# Patient Record
Sex: Female | Born: 1965 | Race: Black or African American | Hispanic: No | Marital: Single | Smoking: Former smoker
Health system: Southern US, Community
[De-identification: ages and names within clinical notes are randomized; demographics above are authoritative.]

## PROBLEM LIST (undated history)

## (undated) DIAGNOSIS — C55 Malignant neoplasm of uterus, part unspecified: Secondary | ICD-10-CM

## (undated) DIAGNOSIS — I10 Essential (primary) hypertension: Secondary | ICD-10-CM

## (undated) DIAGNOSIS — C801 Malignant (primary) neoplasm, unspecified: Secondary | ICD-10-CM

## (undated) HISTORY — PX: ABDOMINAL HYSTERECTOMY: SHX81

---

## 2000-09-12 ENCOUNTER — Emergency Department (HOSPITAL_COMMUNITY): Admission: EM | Admit: 2000-09-12 | Discharge: 2000-09-12 | Payer: Self-pay | Admitting: Emergency Medicine

## 2000-10-24 ENCOUNTER — Emergency Department (HOSPITAL_COMMUNITY): Admission: EM | Admit: 2000-10-24 | Discharge: 2000-10-24 | Payer: Self-pay | Admitting: Emergency Medicine

## 2000-10-25 ENCOUNTER — Encounter: Payer: Self-pay | Admitting: Emergency Medicine

## 2000-11-05 ENCOUNTER — Emergency Department (HOSPITAL_COMMUNITY): Admission: EM | Admit: 2000-11-05 | Discharge: 2000-11-05 | Payer: Self-pay | Admitting: Emergency Medicine

## 2000-11-10 ENCOUNTER — Encounter: Payer: Self-pay | Admitting: General Surgery

## 2000-11-11 ENCOUNTER — Encounter (INDEPENDENT_AMBULATORY_CARE_PROVIDER_SITE_OTHER): Payer: Self-pay | Admitting: *Deleted

## 2000-11-11 ENCOUNTER — Ambulatory Visit (HOSPITAL_COMMUNITY): Admission: RE | Admit: 2000-11-11 | Discharge: 2000-11-12 | Payer: Self-pay | Admitting: General Surgery

## 2002-07-07 ENCOUNTER — Emergency Department (HOSPITAL_COMMUNITY): Admission: EM | Admit: 2002-07-07 | Discharge: 2002-07-07 | Payer: Self-pay | Admitting: Emergency Medicine

## 2002-07-25 ENCOUNTER — Encounter: Admission: RE | Admit: 2002-07-25 | Discharge: 2002-07-25 | Payer: Self-pay | Admitting: *Deleted

## 2003-09-27 ENCOUNTER — Emergency Department (HOSPITAL_COMMUNITY): Admission: AD | Admit: 2003-09-27 | Discharge: 2003-09-27 | Payer: Self-pay | Admitting: Family Medicine

## 2004-02-15 ENCOUNTER — Inpatient Hospital Stay (HOSPITAL_COMMUNITY): Admission: AD | Admit: 2004-02-15 | Discharge: 2004-02-15 | Payer: Self-pay | Admitting: Obstetrics & Gynecology

## 2004-04-01 ENCOUNTER — Emergency Department (HOSPITAL_COMMUNITY): Admission: EM | Admit: 2004-04-01 | Discharge: 2004-04-01 | Payer: Self-pay | Admitting: *Deleted

## 2005-09-22 ENCOUNTER — Emergency Department (HOSPITAL_COMMUNITY): Admission: EM | Admit: 2005-09-22 | Discharge: 2005-09-22 | Payer: Self-pay | Admitting: Emergency Medicine

## 2006-02-15 ENCOUNTER — Ambulatory Visit: Payer: Self-pay | Admitting: Nurse Practitioner

## 2006-02-18 ENCOUNTER — Ambulatory Visit: Payer: Self-pay | Admitting: *Deleted

## 2006-02-19 ENCOUNTER — Ambulatory Visit: Payer: Self-pay | Admitting: Nurse Practitioner

## 2006-03-05 ENCOUNTER — Ambulatory Visit: Payer: Self-pay | Admitting: Nurse Practitioner

## 2006-03-05 ENCOUNTER — Other Ambulatory Visit: Admission: RE | Admit: 2006-03-05 | Discharge: 2006-03-05 | Payer: Self-pay | Admitting: Nurse Practitioner

## 2006-03-07 ENCOUNTER — Ambulatory Visit (HOSPITAL_COMMUNITY): Admission: RE | Admit: 2006-03-07 | Discharge: 2006-03-07 | Payer: Self-pay | Admitting: Family Medicine

## 2006-03-09 ENCOUNTER — Emergency Department (HOSPITAL_COMMUNITY): Admission: EM | Admit: 2006-03-09 | Discharge: 2006-03-09 | Payer: Self-pay | Admitting: Emergency Medicine

## 2006-03-12 ENCOUNTER — Ambulatory Visit (HOSPITAL_COMMUNITY): Admission: RE | Admit: 2006-03-12 | Discharge: 2006-03-12 | Payer: Self-pay | Admitting: Family Medicine

## 2006-03-19 ENCOUNTER — Ambulatory Visit: Payer: Self-pay | Admitting: Nurse Practitioner

## 2006-05-14 ENCOUNTER — Emergency Department (HOSPITAL_COMMUNITY): Admission: EM | Admit: 2006-05-14 | Discharge: 2006-05-14 | Payer: Self-pay | Admitting: Emergency Medicine

## 2006-11-21 ENCOUNTER — Emergency Department (HOSPITAL_COMMUNITY): Admission: EM | Admit: 2006-11-21 | Discharge: 2006-11-21 | Payer: Self-pay | Admitting: Emergency Medicine

## 2007-06-01 ENCOUNTER — Encounter (INDEPENDENT_AMBULATORY_CARE_PROVIDER_SITE_OTHER): Payer: Self-pay | Admitting: *Deleted

## 2008-01-11 ENCOUNTER — Ambulatory Visit: Payer: Self-pay | Admitting: Internal Medicine

## 2008-01-13 ENCOUNTER — Ambulatory Visit: Payer: Self-pay | Admitting: Internal Medicine

## 2008-02-06 ENCOUNTER — Emergency Department (HOSPITAL_COMMUNITY): Admission: EM | Admit: 2008-02-06 | Discharge: 2008-02-06 | Payer: Self-pay | Admitting: Emergency Medicine

## 2009-03-13 ENCOUNTER — Emergency Department (HOSPITAL_COMMUNITY): Admission: EM | Admit: 2009-03-13 | Discharge: 2009-03-13 | Payer: Self-pay | Admitting: Emergency Medicine

## 2009-05-08 ENCOUNTER — Encounter (INDEPENDENT_AMBULATORY_CARE_PROVIDER_SITE_OTHER): Payer: Self-pay | Admitting: Internal Medicine

## 2009-05-08 ENCOUNTER — Ambulatory Visit: Payer: Self-pay | Admitting: Family Medicine

## 2009-05-08 LAB — CONVERTED CEMR LAB
ALT: 11 units/L (ref 0–35)
AST: 15 units/L (ref 0–37)
Albumin: 4.3 g/dL (ref 3.5–5.2)
Alkaline Phosphatase: 90 units/L (ref 39–117)
BUN: 7 mg/dL (ref 6–23)
Basophils Absolute: 0 10*3/uL (ref 0.0–0.1)
Basophils Relative: 1 % (ref 0–1)
CO2: 25 meq/L (ref 19–32)
Calcium: 9.4 mg/dL (ref 8.4–10.5)
Chloride: 103 meq/L (ref 96–112)
Creatinine, Ser: 0.79 mg/dL (ref 0.40–1.20)
Eosinophils Absolute: 0.1 10*3/uL (ref 0.0–0.7)
Eosinophils Relative: 2 % (ref 0–5)
Glucose, Bld: 75 mg/dL (ref 70–99)
HCT: 37 % (ref 36.0–46.0)
Hemoglobin: 11.6 g/dL — ABNORMAL LOW (ref 12.0–15.0)
Lymphocytes Relative: 38 % (ref 12–46)
Lymphs Abs: 2.5 10*3/uL (ref 0.7–4.0)
MCHC: 31.4 g/dL (ref 30.0–36.0)
MCV: 88.5 fL (ref 78.0–100.0)
Monocytes Absolute: 0.5 10*3/uL (ref 0.1–1.0)
Monocytes Relative: 7 % (ref 3–12)
Neutro Abs: 3.4 10*3/uL (ref 1.7–7.7)
Neutrophils Relative %: 52 % (ref 43–77)
Platelets: 220 10*3/uL (ref 150–400)
Potassium: 3.8 meq/L (ref 3.5–5.3)
RBC: 4.18 M/uL (ref 3.87–5.11)
RDW: 14.2 % (ref 11.5–15.5)
Sodium: 141 meq/L (ref 135–145)
Total Bilirubin: 0.3 mg/dL (ref 0.3–1.2)
Total Protein: 7.3 g/dL (ref 6.0–8.3)
WBC: 6.5 10*3/uL (ref 4.0–10.5)

## 2009-05-09 ENCOUNTER — Encounter (INDEPENDENT_AMBULATORY_CARE_PROVIDER_SITE_OTHER): Payer: Self-pay | Admitting: Internal Medicine

## 2009-06-10 ENCOUNTER — Emergency Department (HOSPITAL_COMMUNITY): Admission: EM | Admit: 2009-06-10 | Discharge: 2009-06-10 | Payer: Self-pay | Admitting: Family Medicine

## 2009-08-23 ENCOUNTER — Emergency Department (HOSPITAL_COMMUNITY): Admission: EM | Admit: 2009-08-23 | Discharge: 2009-08-23 | Payer: Self-pay | Admitting: Family Medicine

## 2009-08-30 ENCOUNTER — Ambulatory Visit (HOSPITAL_COMMUNITY): Admission: RE | Admit: 2009-08-30 | Discharge: 2009-08-30 | Payer: Self-pay | Admitting: Internal Medicine

## 2009-09-16 ENCOUNTER — Encounter: Admission: RE | Admit: 2009-09-16 | Discharge: 2009-09-16 | Payer: Self-pay | Admitting: Internal Medicine

## 2009-10-25 ENCOUNTER — Encounter: Admission: RE | Admit: 2009-10-25 | Discharge: 2009-10-25 | Payer: Self-pay | Admitting: Nephrology

## 2010-04-25 ENCOUNTER — Emergency Department (HOSPITAL_COMMUNITY): Admission: EM | Admit: 2010-04-25 | Discharge: 2010-04-25 | Payer: Self-pay | Admitting: Emergency Medicine

## 2010-10-05 ENCOUNTER — Encounter: Payer: Self-pay | Admitting: Internal Medicine

## 2010-10-20 ENCOUNTER — Other Ambulatory Visit: Payer: Self-pay | Admitting: Family Medicine

## 2010-10-20 DIAGNOSIS — Z1231 Encounter for screening mammogram for malignant neoplasm of breast: Secondary | ICD-10-CM

## 2010-10-21 ENCOUNTER — Ambulatory Visit (HOSPITAL_COMMUNITY): Payer: Self-pay

## 2010-10-21 ENCOUNTER — Ambulatory Visit (HOSPITAL_COMMUNITY)
Admission: RE | Admit: 2010-10-21 | Discharge: 2010-10-21 | Disposition: A | Discharge status: Home / Self Care | Payer: Medicaid Other | Source: Ambulatory Visit | Attending: Family Medicine | Admitting: Family Medicine

## 2010-10-21 ENCOUNTER — Encounter (HOSPITAL_COMMUNITY): Payer: Self-pay

## 2010-10-21 DIAGNOSIS — Z1231 Encounter for screening mammogram for malignant neoplasm of breast: Secondary | ICD-10-CM

## 2010-10-21 HISTORY — DX: Malignant (primary) neoplasm, unspecified: C80.1

## 2010-11-24 ENCOUNTER — Other Ambulatory Visit (HOSPITAL_COMMUNITY)
Admission: RE | Admit: 2010-11-24 | Discharge: 2010-11-24 | Disposition: A | Discharge status: Home / Self Care | Payer: Medicaid Other | Source: Ambulatory Visit | Attending: Family Medicine | Admitting: Family Medicine

## 2010-11-24 ENCOUNTER — Other Ambulatory Visit: Payer: Self-pay | Admitting: Family Medicine

## 2010-11-24 ENCOUNTER — Encounter (INDEPENDENT_AMBULATORY_CARE_PROVIDER_SITE_OTHER): Payer: Self-pay | Admitting: *Deleted

## 2010-11-24 DIAGNOSIS — Z01419 Encounter for gynecological examination (general) (routine) without abnormal findings: Secondary | ICD-10-CM | POA: Insufficient documentation

## 2010-11-24 LAB — CONVERTED CEMR LAB
Chlamydia, DNA Probe: NEGATIVE
GC Probe Amp, Genital: NEGATIVE

## 2010-11-28 LAB — COMPREHENSIVE METABOLIC PANEL
ALT: 13 U/L (ref 0–35)
AST: 20 U/L (ref 0–37)
Albumin: 3.7 g/dL (ref 3.5–5.2)
Alkaline Phosphatase: 86 U/L (ref 39–117)
BUN: 6 mg/dL (ref 6–23)
CO2: 28 mEq/L (ref 19–32)
Calcium: 9.2 mg/dL (ref 8.4–10.5)
Chloride: 105 mEq/L (ref 96–112)
Creatinine, Ser: 0.73 mg/dL (ref 0.4–1.2)
GFR calc Af Amer: >60 mL/min (ref >60)
GFR calc non Af Amer: >60 mL/min (ref >60)
Glucose, Bld: 106 mg/dL — ABNORMAL HIGH (ref 70–99)
Potassium: 3.5 mEq/L (ref 3.5–5.1)
Sodium: 141 mEq/L (ref 135–145)
Total Bilirubin: 0.6 mg/dL (ref 0.3–1.2)
Total Protein: 6.7 g/dL (ref 6.0–8.3)

## 2010-11-28 LAB — CBC
HCT: 34.7 % — ABNORMAL LOW (ref 36.0–46.0)
Hemoglobin: 11.7 g/dL — ABNORMAL LOW (ref 12.0–15.0)
MCH: 28.6 pg (ref 26.0–34.0)
MCHC: 33.7 g/dL (ref 30.0–36.0)
MCV: 84.8 fL (ref 78.0–100.0)
Platelets: 206 10*3/uL (ref 150–400)
RBC: 4.09 MIL/uL (ref 3.87–5.11)
RDW: 13.9 % (ref 11.5–15.5)
WBC: 6.8 10*3/uL (ref 4.0–10.5)

## 2010-11-28 LAB — DIFFERENTIAL
Basophils Absolute: 0 10*3/uL (ref 0.0–0.1)
Basophils Relative: 0 % (ref 0–1)
Eosinophils Absolute: 0.1 10*3/uL (ref 0.0–0.7)
Eosinophils Relative: 2 % (ref 0–5)
Lymphocytes Relative: 33 % (ref 12–46)
Lymphs Abs: 2.3 10*3/uL (ref 0.7–4.0)
Monocytes Absolute: 0.5 10*3/uL (ref 0.1–1.0)
Monocytes Relative: 7 % (ref 3–12)
Neutro Abs: 3.9 10*3/uL (ref 1.7–7.7)
Neutrophils Relative %: 58 % (ref 43–77)

## 2010-11-28 LAB — URINALYSIS, ROUTINE W REFLEX MICROSCOPIC
Glucose, UA: NEGATIVE mg/dL
Ketones, ur: NEGATIVE mg/dL
Leukocytes, UA: NEGATIVE
Nitrite: NEGATIVE
Protein, ur: 30 mg/dL — AB
Specific Gravity, Urine: 1.026 (ref 1.005–1.030)
Urobilinogen, UA: 1 mg/dL (ref 0.0–1.0)
pH: 6 (ref 5.0–8.0)

## 2010-11-28 LAB — URINE MICROSCOPIC-ADD ON

## 2010-11-28 LAB — HEMOCCULT GUIAC POC 1CARD (OFFICE): Fecal Occult Bld: NEGATIVE

## 2010-11-28 LAB — LIPASE, BLOOD: Lipase: 22 U/L (ref 11–59)

## 2010-12-12 ENCOUNTER — Other Ambulatory Visit: Payer: Self-pay | Admitting: Nephrology

## 2010-12-12 DIAGNOSIS — N2889 Other specified disorders of kidney and ureter: Secondary | ICD-10-CM

## 2010-12-16 LAB — TSH: TSH: 2.618 u[IU]/mL (ref 0.350–4.500)

## 2010-12-17 ENCOUNTER — Ambulatory Visit
Admission: RE | Admit: 2010-12-17 | Discharge: 2010-12-17 | Disposition: A | Discharge status: Home / Self Care | Payer: Medicaid Other | Source: Ambulatory Visit | Attending: Nephrology | Admitting: Nephrology

## 2010-12-17 DIAGNOSIS — N2889 Other specified disorders of kidney and ureter: Secondary | ICD-10-CM

## 2010-12-17 MED ORDER — IOHEXOL 350 MG/ML SOLN
100.0000 mL | Freq: Once | INTRAVENOUS | Status: AC | PRN
  Administered 2010-12-17: 100 mL via INTRAVENOUS

## 2010-12-22 LAB — TSH: TSH: 1.426 u[IU]/mL (ref 0.350–4.500)

## 2010-12-22 LAB — POCT RAPID STREP A (OFFICE): Streptococcus, Group A Screen (Direct): NEGATIVE

## 2011-01-30 NOTE — H&P (Signed)
Carly Simmons. [facility]  Patient:    Carly Simmons, Carly Simmons                       MRN: [id number] Adm. Date:  [id number] Disc. Date: [id number] Attending:  Lorre Nick                         History and Physical  CHIEF COMPLAINT: The patient is a 45 year old female with epigastric abdominal pain and known gallstones, coming in now for elective laparoscopic cholecystectomy.  HISTORY OF PRESENT ILLNESS: The patient was seen in the emergency room on October 25, 2000 with abdominal pain in the upper abdomen, right upper quadrant, radiating across both sides.  She had nausea and vomiting at that time.  She had an episode prior to that about a month or so beforehand, at which time she had vomited blood.  However, at that time the focus was on bleeding and possible reflux and ulcer disease.  No ultrasound was done. However, on October 25, 2000 an ultrasound was done and this demonstrated dense calcification in the region of the gallbladder fossa consistent with contracted gallbladder filled with multiple stones.  Her common duct was mildly dilated.  Her liver function tests are unknown.  She now comes in for elective laparoscopic cholecystectomy.  PAST MEDICAL/SURGICAL HISTORY:  1. Uterine cancer, for which she had hysterectomy. It is unknown whether or     not her tubes and ovaries were removed but she feels they were both left     in place.  2. History of hypertension.  Blood pressure is currently in control on no     medications.  3. Hypothyroidism.  CURRENT MEDICATIONS: She is only taking pain medications which were given to her in the emergency room.  ALLERGIES:  1. CODEINE.  This is not really an allergy but she does not like taking it     because of a previous history of substance abuse.  2. She also has an intolerance to ASPIRIN.  REVIEW OF SYSTEMS: She has had normal bowel movements.  No jaundice, fever, or chills.  No blood in urine or stool.  She has  had no hematemesis over the past several weeks.  PHYSICAL EXAMINATION:  VITAL SIGNS: On examination in my office her blood pressure was normal at 130/78.  She was afebrile.  HEENT: Head normocephalic, atraumatic.  Sclerae anicteric.  NECK: Supple, without palpable masses and clear.  No bruits.  CHEST: Clear.  CARDIAC: Regular rate and rhythm without murmurs, gallops, lifts, or heaves.  ABDOMEN: Soft, nontender.  No current tenderness in the right upper quadrant. No palpable masses.  RECTAL/PELVIC: Examinations are deferred.  NEUROLOGIC: Cranial nerves 2-12 grossly intact.  LABORATORY DATA: Laboratory studies are pending.  Ultrasound report demonstrates gallstones and contracted gallbladder fossa.  IMPRESSION: Symptomatic cholelithiasis with possible mildly dilated common duct.  PLAN: The plan is to perform laparoscopic cholecystectomy with possible intraoperative cholangiogram.  This will be determined by preoperative liver function tests.  Preoperative antibiotics will be given and she will go to the operating room for laparoscopic procedure.  The risks and benefits of this have been explained to the patient along with the possibility of requiring an open procedure. DD:  11/10/00 TD:  11/11/00 Job: [id number]

## 2011-01-30 NOTE — Op Note (Signed)
[place]  Patient:    Carly Simmons, Carly Simmons                       MRN: [id number] Proc. Date: 11/11/00 Adm. Date:  [id number] Attending:  Cherylynn Ridges                           Operative Report  PREOPERATIVE DIAGNOSIS:  Symptomatic cholelithiasis.  POSTOPERATIVE DIAGNOSIS:  Symptomatic cholelithiasis.  OPERATION PERFORMED:  Laparoscopic cholecystectomy.  SURGEON:  Jimmye Norman, M.D.  ASSISTANT:  None.  ANESTHESIA:  General endotracheal.  ESTIMATED BLOOD LOSS:  Less than 20 cc.  COMPLICATIONS:  None.  CONDITION:  Stable.  FINDINGS:  Chronic gallbladder disease, no evident of acute inflammation. Anatomy appeared normal.  No cholangiogram was done.  INDICATIONS FOR PROCEDURE:  The patient is a 45 year old female admitted with multiple attacks of abdominal pain, epigastric.  Had been treated for upper gastrointestinal ulcer disease found on most recent hospital emergency room admission to have gallstones and was brought to the operating room for cholecystectomy.  DESCRIPTION OF PROCEDURE:  The patient was taken to the operating room and placed on the table in supine position.  After an adequate general anesthetic was administered, he was prepped and draped in the usual sterile fashion exposing the midline and the right upper quadrant of the abdomen.  A superumbilical curvilinear incision was made down to the midline fascia through which a Veress needle was passed into the peritoneal cavity while tenting up on the anterior abdominal wall using sharp towel clamps.  We confirmed positioning of the Veress needle using the saline test.  Once this was done, carbon dioxide insufflation was instilled into the peritoneal cavity through the Veress needle up to a maximal intra-abdominal pressure of 15 mmHg. Subsequently, an 11 mm cannula was passed through the superumbilical fascia into the peritoneal cavity and confirmed to be in adequate position  using the laparoscope with attached camera and light source.  Subsequently, two 5 mm cannulas and trocars and a subxiphoid 11 mm cannula and trocar were passed into the peritoneal cavity under direct vision.  The patient was placed in steeper reverse Trendelenburg position.  The left side was tilted down and the dissection was begun.  The dome of the gallbladder was grasped and retracted into the right upper quadrant and the anterior abdominal wall portion of the abdomen.  We then placed a second grasper onto the infundibulum of the gallbladder.  This opened up the peritoneum overlying the triangle of Calot and hepatoduodenal triangle. The primary structures of the cystic duct and the cystic artery were dissected out using electrocautery and blunt dissection and proximal triple Endoclips were placed on the cystic duct and cystic artery and subsequently transected after distal ligation with the clips.  We then dissected the gallbladder out of its bed with minimal difficulty.  We removed it from the superumbilical fascia using the Endocatch bag with minimal spillage.  There was some bile spillage into the abdomen which was irrigated out.  Subsequent to irrigation and controlling hemostasis in the gallbladder bed, and removing the gallbladder from the superumbilical fascia, we closed the abdomen after allownig most of the gas to escape through the subxiphoid incision.  We closed the midline superumbilical fascia using a figure-of-eight stitch of 0 Vicryl. This was passed on a UR-6 needle.  Subsequently, the skin was injected and subcutaneous tissues injected with 0.25% Marcaine with epinephrine.  The skin was closed using running subcuticular stitch of 4-0 Vicryl.  Steri-Strips were applied to the wound along with sterile dressings.  Sponge, needle and instrument counts were correct at the conclusion of the case. DD:  11/11/00 TD:  11/11/00 Job: [id number]

## 2011-05-07 ENCOUNTER — Inpatient Hospital Stay (INDEPENDENT_AMBULATORY_CARE_PROVIDER_SITE_OTHER)
Admission: RE | Admit: 2011-05-07 | Discharge: 2011-05-07 | Disposition: A | Discharge status: Home / Self Care | Payer: Medicare Other | Source: Ambulatory Visit | Attending: Family Medicine | Admitting: Family Medicine

## 2011-05-07 DIAGNOSIS — K219 Gastro-esophageal reflux disease without esophagitis: Secondary | ICD-10-CM

## 2011-08-14 ENCOUNTER — Emergency Department (INDEPENDENT_AMBULATORY_CARE_PROVIDER_SITE_OTHER): Payer: Medicare Other

## 2011-08-14 ENCOUNTER — Encounter (HOSPITAL_COMMUNITY): Payer: Self-pay | Admitting: Emergency Medicine

## 2011-08-14 ENCOUNTER — Emergency Department (INDEPENDENT_AMBULATORY_CARE_PROVIDER_SITE_OTHER)
Admission: EM | Admit: 2011-08-14 | Discharge: 2011-08-14 | Disposition: A | Discharge status: Home / Self Care | Payer: Medicare Other | Source: Home / Self Care | Attending: Emergency Medicine | Admitting: Emergency Medicine

## 2011-08-14 DIAGNOSIS — H6690 Otitis media, unspecified, unspecified ear: Secondary | ICD-10-CM

## 2011-08-14 DIAGNOSIS — J111 Influenza due to unidentified influenza virus with other respiratory manifestations: Secondary | ICD-10-CM

## 2011-08-14 HISTORY — DX: Essential (primary) hypertension: I10

## 2011-08-14 MED ORDER — AMOXICILLIN 500 MG PO CAPS: 1000.0000 mg | ORAL_CAPSULE | Freq: Three times a day (TID) | ORAL | Status: AC

## 2011-08-14 MED ORDER — IBUPROFEN 800 MG PO TABS
ORAL_TABLET | ORAL | Status: AC
  Filled 2011-08-14: qty 1

## 2011-08-14 MED ORDER — DICLOFENAC SODIUM 75 MG PO TBEC: 75.0000 mg | DELAYED_RELEASE_TABLET | Freq: Two times a day (BID) | ORAL | Status: AC

## 2011-08-14 MED ORDER — OSELTAMIVIR PHOSPHATE 75 MG PO CAPS: 75.0000 mg | ORAL_CAPSULE | Freq: Two times a day (BID) | ORAL | Status: AC

## 2011-08-14 MED ORDER — BENZONATATE 200 MG PO CAPS: 200.0000 mg | ORAL_CAPSULE | Freq: Three times a day (TID) | ORAL | Status: AC | PRN

## 2011-08-14 MED ORDER — IBUPROFEN 800 MG PO TABS
800.0000 mg | ORAL_TABLET | Freq: Once | ORAL | Status: AC
  Administered 2011-08-14: 800 mg via ORAL

## 2011-08-14 NOTE — ED Notes (Signed)
C/o of body aches, headaches, chest compression, ears popping and bad cough for 2 days. Also with productive cough that is "milky and brown".

## 2011-08-14 NOTE — ED Provider Notes (Signed)
History     CSN: [id number] Arrival date & time: 08/14/2011  6:11 PM   First MD Initiated Contact with Patient 08/14/11 1709      Chief Complaint  Patient presents with  . Generalized Body Aches    (Consider location/radiation/quality/duration/timing/severity/associated sxs/prior treatment) HPI Comments: Carly Simmons has had a two-day history of generalized aching, headache, fever, fatigue, cough productive of small amounts of brown sputum, wheezing, nasal congestion with clear drainage, and bilateral ear pain. She was exposed to some family members with similar problems. She has not had a flu vaccine this year. She does have a history of asthma. She's been taking Alka-Seltzer and NyQuil. She rates her pain as a 9 at its maximum and a 6 right now.   Past Medical History  Diagnosis Date  . Cancer   . Hypertension     Past Surgical History  Procedure Date  . Abdominal hysterectomy     History reviewed. No pertinent family history.  History  Substance Use Topics  . Smoking status: Not on file  . Smokeless tobacco: Not on file  . Alcohol Use: No    OB History    Grav Para Term Preterm Abortions TAB SAB Ect Mult Living                  Review of Systems  Constitutional: Positive for fever, chills and fatigue. Negative for unexpected weight change.  HENT: Positive for congestion, sore throat and rhinorrhea. Negative for ear pain.   Eyes: Negative for redness and visual disturbance.  Respiratory: Positive for cough. Negative for shortness of breath and wheezing.   Cardiovascular: Negative for chest pain and palpitations.  Gastrointestinal: Negative for nausea, vomiting, abdominal pain, diarrhea and constipation.  Genitourinary: Negative for dysuria, urgency and frequency.  Skin: Negative for rash.    Allergies  Codeine and Iohexol  Home Medications   Current Outpatient Rx  Name Route Sig Dispense Refill  . NYQUIL PO Oral Take by mouth as needed.      Marland Kitchen ALKA-SELTZER  GOLD PO Oral Take by mouth as needed.      . AMOXICILLIN 500 MG PO CAPS Oral Take 2 capsules (1,000 mg total) by mouth 3 (three) times daily. 60 capsule 0  . BENZONATATE 200 MG PO CAPS Oral Take 1 capsule (200 mg total) by mouth 3 (three) times daily as needed for cough. 30 capsule 0  . DICLOFENAC SODIUM 75 MG PO TBEC Oral Take 1 tablet (75 mg total) by mouth 2 (two) times daily. 20 tablet 0  . OSELTAMIVIR PHOSPHATE 75 MG PO CAPS Oral Take 1 capsule (75 mg total) by mouth every 12 (twelve) hours. 10 capsule 0    BP 154/100  Pulse 108  Temp(Src) 99.7 F (37.6 C) (Oral)  SpO2 97%  Physical Exam  Nursing note and vitals reviewed. Constitutional: She appears well-developed and well-nourished. No distress.  HENT:  Head: Normocephalic and atraumatic.  Right Ear: External ear normal.  Left Ear: External ear normal.  Nose: Nose normal.  Mouth/Throat: Oropharynx is clear and moist. No oropharyngeal exudate.       Her pharynx is erythematous. The left tympanic membrane was pink the right tympanic membrane was normal.  Eyes: Conjunctivae and EOM are normal. Pupils are equal, round, and reactive to light. Right eye exhibits no discharge. Left eye exhibits no discharge.  Neck: Normal range of motion. Neck supple.  Cardiovascular: Normal rate, regular rhythm and normal heart sounds.   Pulmonary/Chest: Effort normal and breath sounds  normal. No stridor. No respiratory distress. She has no wheezes. She has no rales. She exhibits no tenderness.  Lymphadenopathy:    She has no cervical adenopathy.  Skin: Skin is warm and dry. No rash noted. She is not diaphoretic.    ED Course  Procedures (including critical care time)  Labs Reviewed - No data to display Dg Chest 2 View  08/14/2011  *RADIOLOGY REPORT*  Clinical Data: Cough and fever.  CHEST - 2 VIEW  Comparison: None.  Findings: Two views of the chest demonstrate clear lungs. Heart and mediastinum are within normal limits.  The trachea is  midline. Bony structures are intact.  IMPRESSION: No acute chest findings.  Original Report Authenticated By: Richarda Overlie, M.D.     1. Influenza   2. Otitis media       MDM  She clinically has influenza. She was given a prescription for Tamiflu, although she's not sure she will get this filled because of the cost. We'll go ahead and treat her left otitis media with amoxicillin. She was also given something for cough and something for pain.        Roque Lias, MD 08/14/11 [phone]

## 2012-06-05 ENCOUNTER — Encounter (HOSPITAL_COMMUNITY): Payer: Self-pay | Admitting: Emergency Medicine

## 2012-06-05 ENCOUNTER — Emergency Department (INDEPENDENT_AMBULATORY_CARE_PROVIDER_SITE_OTHER)
Admission: EM | Admit: 2012-06-05 | Discharge: 2012-06-05 | Disposition: A | Discharge status: Home / Self Care | Payer: Medicare Other | Source: Home / Self Care | Attending: Emergency Medicine | Admitting: Emergency Medicine

## 2012-06-05 DIAGNOSIS — H109 Unspecified conjunctivitis: Secondary | ICD-10-CM

## 2012-06-05 MED ORDER — TOBRAMYCIN 0.3 % OP SOLN: 1.0000 [drp] | Freq: Three times a day (TID) | OPHTHALMIC | Status: AC

## 2012-06-05 MED ORDER — CETIRIZINE-PSEUDOEPHEDRINE ER 5-120 MG PO TB12: 1.0000 | ORAL_TABLET | Freq: Every day | ORAL | Status: DC

## 2012-06-05 MED ORDER — TETRAHYDROZOLINE HCL 0.05 % OP SOLN: 1.0000 [drp] | Freq: Three times a day (TID) | OPHTHALMIC | Status: AC

## 2012-06-05 NOTE — ED Notes (Signed)
Pt c/o irritation of the right eye since last nigh, denies paint. Pt states that when she woke this morning there was drainage from her eye. Pt denies fever, n/v.

## 2012-06-05 NOTE — ED Provider Notes (Signed)
History     CSN: [id number]  Arrival date & time 06/05/12  1000   First MD Initiated Contact with Patient 06/05/12 1007      Chief Complaint  Patient presents with  . Conjunctivitis    right eye irritation    (Consider location/radiation/quality/duration/timing/severity/associated sxs/prior treatment) HPI Comments: Patient presents to urgent care as it was last night she have had some discomfort and itchiness on her right eye denies any pain or recent injury and denies using contact lenses patient said she woke up this morning there was some colored drainage coming out of her right eye and her eye was matted shut. Denies any visual changes or further symptoms such as headache, visual changes or injury or trauma.  Patient is a 46 y.o. female presenting with conjunctivitis. The history is provided by the patient.  Conjunctivitis  The current episode started today. The onset was sudden. The problem has been unchanged. The problem is mild. Nothing relieves the symptoms. Exacerbated by: Blinking. Associated symptoms include eye itching, eye discharge and eye redness. Pertinent negatives include no fever, no decreased vision, no photophobia, no headaches, no rhinorrhea, no sore throat, no stridor, no swollen glands, no cough and no eye pain.    Past Medical History  Diagnosis Date  . Cancer   . Hypertension     Past Surgical History  Procedure Date  . Abdominal hysterectomy     History reviewed. No pertinent family history.  History  Substance Use Topics  . Smoking status: Not on file  . Smokeless tobacco: Not on file  . Alcohol Use: No    OB History    Grav Para Term Preterm Abortions TAB SAB Ect Mult Living                  Review of Systems  Constitutional: Negative for fever and activity change.  HENT: Negative for sore throat and rhinorrhea.   Eyes: Positive for discharge, redness and itching. Negative for photophobia, pain and visual disturbance.  Respiratory:  Negative for cough and stridor.   Neurological: Negative for dizziness and headaches.    Allergies  Codeine and Iohexol  Home Medications   Current Outpatient Rx  Name Route Sig Dispense Refill  . CETIRIZINE-PSEUDOEPHEDRINE ER 5-120 MG PO TB12 Oral Take 1 tablet by mouth daily. 30 tablet 0  . DICLOFENAC SODIUM 75 MG PO TBEC Oral Take 1 tablet (75 mg total) by mouth 2 (two) times daily. 20 tablet 0  . NYQUIL PO Oral Take by mouth as needed.      Marland Kitchen ALKA-SELTZER GOLD PO Oral Take by mouth as needed.      . TETRAHYDROZOLINE HCL 0.05 % OP SOLN Right Eye Place 1 drop into the right eye 3 (three) times daily. 15 mL 0  . TOBRAMYCIN SULFATE 0.3 % OP SOLN Right Eye Place 1 drop into the right eye 3 (three) times daily. 5 mL 0    BP 116/78  Pulse 66  Temp 98.6 F (37 C)  SpO2 100%  Physical Exam  Nursing note and vitals reviewed. Constitutional: She appears well-developed and well-nourished.  Eyes: EOM are normal. Pupils are equal, round, and reactive to light. No foreign bodies found. Right eye exhibits no chemosis and no discharge. Left eye exhibits no chemosis and no discharge. Right conjunctiva is injected. Right conjunctiva has no hemorrhage. Left conjunctiva is not injected. Left conjunctiva has no hemorrhage.  Neurological: She is alert.  Skin: No rash noted. No erythema.  ED Course  Procedures (including critical care time)  Labs Reviewed - No data to display No results found.   1. Conjunctivitis       MDM  Right-sided conjunctival hyperemia- no corneal disruption under fluorescein, test.tobrymicin prescription          Jimmie Molly, MD 06/05/12 1617

## 2012-06-23 ENCOUNTER — Other Ambulatory Visit: Payer: Self-pay | Admitting: Internal Medicine

## 2012-06-23 DIAGNOSIS — M810 Age-related osteoporosis without current pathological fracture: Secondary | ICD-10-CM

## 2012-07-22 ENCOUNTER — Emergency Department (INDEPENDENT_AMBULATORY_CARE_PROVIDER_SITE_OTHER)
Admission: EM | Admit: 2012-07-22 | Discharge: 2012-07-22 | Disposition: A | Discharge status: Home / Self Care | Payer: Medicare Other | Source: Home / Self Care

## 2012-07-22 ENCOUNTER — Encounter (HOSPITAL_COMMUNITY): Payer: Self-pay | Admitting: Emergency Medicine

## 2012-07-22 ENCOUNTER — Other Ambulatory Visit: Payer: Self-pay

## 2012-07-22 DIAGNOSIS — M940 Chondrocostal junction syndrome [Tietze]: Secondary | ICD-10-CM

## 2012-07-22 MED ORDER — NAPROXEN 500 MG PO TBEC: 500.0000 mg | DELAYED_RELEASE_TABLET | Freq: Two times a day (BID) | ORAL | Status: DC

## 2012-07-22 NOTE — ED Provider Notes (Signed)
History     CSN: [id number]  Arrival date & time 07/22/12  1153   None     Chief Complaint  Patient presents with  . Chest Pain    (Consider location/radiation/quality/duration/timing/severity/associated sxs/prior treatment) HPI Comments: 46 year old female who presents with complaints of left chest pain for 2 days. The pain is sharp, heating-type pain is located beneath the breast. She places her right hand beneath the breast over the lower costal margin and along the lateral left chest wall. She also states her some pain under the left scapula. Nothing makes it worse, immobility and naps help her get better. She denies shortness of breath, fever, cough or malaise. No GI or GU symptoms. Denies heaviness tightness fullness or pressure in the chest. She has been starting to exercise recently mainly by walking and taking deep breaths 2 to exercise may have contributed to his chest wall pain.   Past Medical History  Diagnosis Date  . Cancer   . Hypertension     Past Surgical History  Procedure Date  . Abdominal hysterectomy     No family history on file.  History  Substance Use Topics  . Smoking status: Not on file  . Smokeless tobacco: Not on file  . Alcohol Use: No    OB History    Grav Para Term Preterm Abortions TAB SAB Ect Mult Living                  Review of Systems  Constitutional: Negative for fever, chills and activity change.  HENT: Negative.   Respiratory: Negative.   Cardiovascular: Negative.   Musculoskeletal: Negative for arthralgias and gait problem.       As per HPI  Skin: Negative for color change, pallor and rash.  Neurological: Negative.     Allergies  Codeine and Iohexol  Home Medications   Current Outpatient Rx  Name  Route  Sig  Dispense  Refill  . SIMVASTATIN 20 MG PO TABS   Oral   Take 20 mg by mouth every evening.         Marland Kitchen CETIRIZINE-PSEUDOEPHEDRINE ER 5-120 MG PO TB12   Oral   Take 1 tablet by mouth daily.   30 tablet  0   . DICLOFENAC SODIUM 75 MG PO TBEC   Oral   Take 1 tablet (75 mg total) by mouth 2 (two) times daily.   20 tablet   0   . NAPROXEN 500 MG PO TBEC   Oral   Take 1 tablet (500 mg total) by mouth 2 (two) times daily with a meal.   20 tablet   0   . NYQUIL PO   Oral   Take by mouth as needed.           Marland Kitchen ALKA-SELTZER GOLD PO   Oral   Take by mouth as needed.             BP 131/90  Pulse 70  Temp 98.6 F (37 C) (Oral)  Resp 20  SpO2 100%  LMP 06/21/2012  Physical Exam  Constitutional: She is oriented to person, place, and time. She appears well-developed and well-nourished. No distress.  HENT:  Head: Normocephalic and atraumatic.  Eyes: EOM are normal. Pupils are equal, round, and reactive to light.  Neck: Normal range of motion. Neck supple.  Cardiovascular: Normal rate and normal heart sounds.   Pulmonary/Chest: Effort normal and breath sounds normal. No respiratory distress.  Abdominal: Soft. There is no tenderness. There is  no rebound and no guarding.  Musculoskeletal: She exhibits tenderness. She exhibits no edema.       Marked, reproducible tenderness in the right anterior and lateral costal margins. Tracking the left lateral ribs into the infrascapular musculature produces tenderness and pain. The patient palpates her own breast and denies thirst tenderness there.(The patient was dressed during the exam.)  Lymphadenopathy:    She has no cervical adenopathy.  Neurological: She is alert and oriented to person, place, and time. No cranial nerve deficit.  Skin: Skin is warm and dry.  Psychiatric: She has a normal mood and affect.    ED Course  Procedures (including critical care time)  Labs Reviewed - No data to display No results found.   1. Costochondritis, acute       MDM  Reassurance. He explained costochondritis and chest wall pain to the patient. She may apply ice off and on to the areas of soreness. Also Naprosyn EC 500 mg twice a day when  necessary pain. She may continue to walk however if the pain increases by taking deeper breaths he may have to slow down old it otherwise this should be okay. She has been advised that this is not a problem that is usually associated with the heart lungs or vital organs. EKG: Normal sinus rhythm with no ischemic changes. Suboptimal R-wave progression in the precordial leads       Hayden Rasmussen, NP 07/22/12 1417  Hayden Rasmussen, NP 07/22/12 1418

## 2012-07-22 NOTE — ED Notes (Signed)
Pt c/o chest discomfort on the left side x2 days... Sx include: pain at left side under breast/rib that radiates downward to left abd, frontal headache... Denies: fevers, vomiting, nauseas, diarrhea, edema, SOB... Hx of GERD... Pt is alert w/no signs of distress.

## 2012-07-24 NOTE — ED Provider Notes (Signed)
Medical screening examination/treatment/procedure(s) were performed by non-physician practitioner and as supervising physician I was immediately available for consultation/collaboration.   MORENO-COLL,Gerarda Conklin; MD   Brannan Cassedy Moreno-Coll, MD 07/24/12 0827 

## 2012-10-21 ENCOUNTER — Other Ambulatory Visit: Payer: Self-pay | Admitting: Internal Medicine

## 2012-10-21 DIAGNOSIS — Z1231 Encounter for screening mammogram for malignant neoplasm of breast: Secondary | ICD-10-CM

## 2012-11-16 ENCOUNTER — Ambulatory Visit
Admission: RE | Admit: 2012-11-16 | Discharge: 2012-11-16 | Disposition: A | Discharge status: Home / Self Care | Payer: Medicare Other | Source: Ambulatory Visit | Attending: Internal Medicine | Admitting: Internal Medicine

## 2012-11-16 DIAGNOSIS — Z1231 Encounter for screening mammogram for malignant neoplasm of breast: Secondary | ICD-10-CM

## 2013-02-23 ENCOUNTER — Emergency Department (HOSPITAL_COMMUNITY)
Admission: EM | Admit: 2013-02-23 | Discharge: 2013-02-23 | Disposition: A | Discharge status: Home / Self Care | Payer: Medicare Other | Source: Home / Self Care

## 2013-02-23 ENCOUNTER — Encounter (HOSPITAL_COMMUNITY): Payer: Self-pay

## 2013-02-23 DIAGNOSIS — R0982 Postnasal drip: Secondary | ICD-10-CM

## 2013-02-23 DIAGNOSIS — J309 Allergic rhinitis, unspecified: Secondary | ICD-10-CM

## 2013-02-23 DIAGNOSIS — K219 Gastro-esophageal reflux disease without esophagitis: Secondary | ICD-10-CM

## 2013-02-23 NOTE — ED Provider Notes (Signed)
History     CSN: [id number]  Arrival date & time 02/23/13  1209   None     Chief Complaint  Patient presents with  . Gastrophageal Reflux    (Consider location/radiation/quality/duration/timing/severity/associated sxs/prior treatment) HPI Comments: 47 year old morbidly obese female states that 2 days ago she was awakened during the night with frequent episodes of regurgitation, heartburn and spitting up has his secretions. She experienced burning in the esophagus and the throat. She started taking the purple pill at that time and now, 2 days later she is feeling just a little bit better. She is concerned that since she had gallbladder surgery several years ago that she may have "busted something open ". Denies abdominal pain. States when pressing on the epigastrium this actually helped her to feel better. Also complaining of cough and is clearing her throat frequently. She is being treated for allergy desensitization.   Past Medical History  Diagnosis Date  . Cancer   . Hypertension     Past Surgical History  Procedure Laterality Date  . Abdominal hysterectomy      History reviewed. No pertinent family history.  History  Substance Use Topics  . Smoking status: Not on file  . Smokeless tobacco: Not on file  . Alcohol Use: No    OB History   Grav Para Term Preterm Abortions TAB SAB Ect Mult Living                  Review of Systems  Constitutional: Negative for fever, chills, activity change, appetite change and fatigue.  HENT: Positive for congestion, sore throat and rhinorrhea. Negative for facial swelling, neck pain and neck stiffness.   Eyes: Negative.   Respiratory: Negative.   Cardiovascular: Negative.   Gastrointestinal: Positive for vomiting. Negative for abdominal pain and blood in stool.       As per history of present illness  Genitourinary: Negative.   Musculoskeletal: Negative.   Skin: Negative for pallor and rash.  Neurological: Negative.      Allergies  Codeine and Iohexol  Home Medications   Current Outpatient Rx  Name  Route  Sig  Dispense  Refill  . cetirizine-pseudoephedrine (ZYRTEC-D) 5-120 MG per tablet   Oral   Take 1 tablet by mouth daily.   30 tablet   0   . naproxen (EC-NAPROSYN) 500 MG EC tablet   Oral   Take 1 tablet (500 mg total) by mouth 2 (two) times daily with a meal.   20 tablet   0   . Pseudoeph-Doxylamine-DM-APAP (NYQUIL PO)   Oral   Take by mouth as needed.           . simvastatin (ZOCOR) 20 MG tablet   Oral   Take 20 mg by mouth every evening.         . Sodium & Potassium Bicarbonate (ALKA-SELTZER GOLD PO)   Oral   Take by mouth as needed.             BP 124/77  Pulse 64  Temp(Src) 98.3 F (36.8 C) (Oral)  Resp 16  SpO2 99%  LMP 06/21/2012  Physical Exam  Nursing note and vitals reviewed. Constitutional: She is oriented to person, place, and time. She appears well-developed and well-nourished. No distress.  HENT:  Oropharynx with spotty erythema and clear PND. No exudates or swelling.  Eyes: Conjunctivae and EOM are normal.  Neck: Normal range of motion. Neck supple.  Cardiovascular: Normal rate, regular rhythm and normal heart sounds.  Pulmonary/Chest: Effort normal and breath sounds normal. No respiratory distress.  Abdominal: Soft. She exhibits no distension and no mass. There is no rebound and no guarding.  Minor tenderness in the epigastrium.  Musculoskeletal: She exhibits no edema and no tenderness.  Lymphadenopathy:    She has no cervical adenopathy.  Neurological: She is alert and oriented to person, place, and time. She exhibits normal muscle tone.  Skin: Skin is warm and dry. No rash noted.  Psychiatric: She has a normal mood and affect.    ED Course  Procedures (including critical care time)  Labs Reviewed - No data to display No results found.   1. GERD (gastroesophageal reflux disease)   2. Allergic rhinitis due to allergen   3. PND  (post-nasal drip)       MDM  The patient has typical symptoms of GERD. She also has symptoms of allergic rhinitis and PND. Both of these are protruding to her cough. She is given instructions relating to GERD and diet associated with GERD. Is also advised to take Pepcid a.c. or Zantac 75 mg twice a day or just at bedtime as needed for symptom relief. She may also take Claritin 10 mg or Allegra 60 mg at bed time or during the day to help with allergies. Continue taking your purple pill (Nexium or Prilosec) as directed. Call your physician for an appointment to be followed. May need to have additional studies to include H. pylori and endoscopies.         Hayden Rasmussen, NP 02/23/13 [phone]

## 2013-02-23 NOTE — ED Notes (Signed)
My reflux is acting up right now, making me cough; I have been taking my purple pill because I need it ( not taking it all the time)

## 2013-04-07 ENCOUNTER — Emergency Department (INDEPENDENT_AMBULATORY_CARE_PROVIDER_SITE_OTHER)
Admission: EM | Admit: 2013-04-07 | Discharge: 2013-04-07 | Disposition: A | Discharge status: Home / Self Care | Payer: Medicare Other | Source: Home / Self Care

## 2013-04-07 DIAGNOSIS — J309 Allergic rhinitis, unspecified: Secondary | ICD-10-CM

## 2013-04-07 DIAGNOSIS — R0982 Postnasal drip: Secondary | ICD-10-CM

## 2013-04-07 MED ORDER — FLUTICASONE PROPIONATE 50 MCG/ACT NA SUSP: 2.0000 | Freq: Every day | NASAL | Status: DC

## 2013-04-07 MED ORDER — FEXOFENADINE HCL 180 MG PO TABS: 180.0000 mg | ORAL_TABLET | Freq: Every day | ORAL | Status: DC

## 2013-04-07 MED ORDER — PHENYLEPHRINE-CHLORPHEN-DM 10-4-12.5 MG/5ML PO LIQD: 5.0000 mL | ORAL | Status: DC | PRN

## 2013-04-07 NOTE — ED Provider Notes (Signed)
CSN: [id number]     Arrival date & time 04/07/13  0957 History     First MD Initiated Contact with Patient 04/07/13 1027     Chief Complaint  Patient presents with  . Facial Pain   (Consider location/radiation/quality/duration/timing/severity/associated sxs/prior Treatment) HPI Comments: 47 year old female is complaining of pressure in the right side of her face over the maxillary and frontal sinuses. She is complaining of associated rhinorrhea, cough and PND. Denies earache, fever or shortness of breath. She is currently taking no medications for the symptoms.   Past Medical History  Diagnosis Date  . Cancer   . Hypertension    Past Surgical History  Procedure Laterality Date  . Abdominal hysterectomy     No family history on file. History  Substance Use Topics  . Smoking status: Not on file  . Smokeless tobacco: Not on file  . Alcohol Use: No   OB History   Grav Para Term Preterm Abortions TAB SAB Ect Mult Living                 Review of Systems  Constitutional: Negative for fever, chills, activity change, appetite change and fatigue.  HENT: Positive for congestion, rhinorrhea, sneezing, postnasal drip and sinus pressure. Negative for ear pain, sore throat, facial swelling, neck pain, neck stiffness and ear discharge.   Eyes: Negative.   Respiratory: Positive for cough.   Cardiovascular: Negative.   Gastrointestinal: Negative.   Skin: Negative for pallor and rash.  Neurological: Negative.     Allergies  Codeine and Iohexol  Home Medications   Current Outpatient Rx  Name  Route  Sig  Dispense  Refill  . ARIPiprazole (ABILIFY) 2 MG tablet   Oral   Take 2 mg by mouth daily.         . cetirizine-pseudoephedrine (ZYRTEC-D) 5-120 MG per tablet   Oral   Take 1 tablet by mouth daily.   30 tablet   0   . simvastatin (ZOCOR) 20 MG tablet   Oral   Take 20 mg by mouth every evening.         . fexofenadine (ALLEGRA) 180 MG tablet   Oral   Take 1 tablet  (180 mg total) by mouth daily.   14 tablet   0   . fluticasone (FLONASE) 50 MCG/ACT nasal spray   Nasal   Place 2 sprays into the nose daily.   1 g   1   . naproxen (EC-NAPROSYN) 500 MG EC tablet   Oral   Take 1 tablet (500 mg total) by mouth 2 (two) times daily with a meal.   20 tablet   0   . Phenylephrine-Chlorphen-DM 06-18-11.5 MG/5ML LIQD   Oral   Take 5 mLs by mouth every 4 (four) hours as needed. Take 5 ml before bedtime prn cough, drainage   120 mL   0   . Pseudoeph-Doxylamine-DM-APAP (NYQUIL PO)   Oral   Take by mouth as needed.           . Sodium & Potassium Bicarbonate (ALKA-SELTZER GOLD PO)   Oral   Take by mouth as needed.            BP 115/63  Pulse 76  Temp(Src) 98.2 F (36.8 C) (Oral)  Resp 18  SpO2 99%  LMP 06/21/2012 Physical Exam  Nursing note and vitals reviewed. Constitutional: She is oriented to person, place, and time. She appears well-developed and well-nourished. No distress.  HENT:  Head: Normocephalic and atraumatic.  Mild TM retraction bilaterally. Oropharynx with minor erythema and injection with evidence of PND. No purulence or exudates.  Eyes: EOM are normal. Pupils are equal, round, and reactive to light.  Neck: Normal range of motion. Neck supple.  Cardiovascular: Normal rate, regular rhythm and normal heart sounds.   Pulmonary/Chest: Effort normal and breath sounds normal. No respiratory distress. She has no wheezes. She has no rales.  Musculoskeletal: She exhibits no edema and no tenderness.  Lymphadenopathy:    She has no cervical adenopathy.  Neurological: She is alert and oriented to person, place, and time. No cranial nerve deficit.  Skin: Skin is warm and dry. No rash noted.  Psychiatric: She has a normal mood and affect.    ED Course   Procedures (including critical care time)  Labs Reviewed - No data to display No results found. 1. Allergic rhinitis due to allergen   2. Allergic sinusitis   3. PND  (post-nasal drip)     MDM  Norell CS 1 teaspoon q. at bedtime when necessary Drink plenty of fluids stay well hydrated During the day take Allegra 180 mg by mouth and Sudafed PE 10 mg every 4 hours when necessary congestion sinuses. Do not take the Sudafed PE at nighttime you are taking the Norell liquid. Use the fluticasone nasal spray daily as directed  Hayden Rasmussen, NP 04/07/13 1056  Hayden Rasmussen, NP 04/07/13 1057  Hayden Rasmussen, NP 04/07/13 1058

## 2013-04-07 NOTE — ED Notes (Signed)
Complaining of facial sinus problems for two days.  OTC medications has been used but no relief.  Patient states she does have a productive cough with yellowish bloody mucus.

## 2013-04-08 NOTE — ED Provider Notes (Signed)
Medical screening examination/treatment/procedure(s) were performed by a resident physician or non-physician practitioner and as the supervising physician I was immediately available for consultation/collaboration.  Clementeen Graham, MD   Rodolph Bong, MD 04/08/13 [phone]

## 2013-05-08 ENCOUNTER — Encounter (HOSPITAL_COMMUNITY): Payer: Self-pay

## 2013-05-08 ENCOUNTER — Emergency Department (INDEPENDENT_AMBULATORY_CARE_PROVIDER_SITE_OTHER)
Admission: EM | Admit: 2013-05-08 | Discharge: 2013-05-08 | Disposition: A | Discharge status: Home / Self Care | Payer: Medicare Other | Source: Home / Self Care | Attending: Emergency Medicine | Admitting: Emergency Medicine

## 2013-05-08 DIAGNOSIS — K209 Esophagitis, unspecified without bleeding: Secondary | ICD-10-CM

## 2013-05-08 DIAGNOSIS — L83 Acanthosis nigricans: Secondary | ICD-10-CM

## 2013-05-08 LAB — GLUCOSE, CAPILLARY: Glucose-Capillary: 86 mg/dL (ref 70–99)

## 2013-05-08 MED ORDER — ESOMEPRAZOLE MAGNESIUM 40 MG PO CPDR: 40.0000 mg | DELAYED_RELEASE_CAPSULE | Freq: Every day | ORAL | Status: DC

## 2013-05-08 NOTE — ED Provider Notes (Signed)
CSN: [id number]     Arrival date & time 05/08/13  1038 History     First MD Initiated Contact with Patient 05/08/13 1059     Chief Complaint  Patient presents with  . Abdominal Pain   (Consider location/radiation/quality/duration/timing/severity/associated sxs/prior Treatment) HPI Comments: Patient presents to urgent care with 2 concerns. She does have a primary care Dr. she describes that #1 she has developed a dark looking spot in the left side of her upper abdomen that she's wondering if that has anything to do with her previous gallbladder surgery?  " It's darker than the rest of my skin" ( patient points to left epigastric region where she has a hyperpigmented region)   Patient also describes that she's been having reflux with frequent burping and sometimes discomfort and burning sensation in her stomach for many years. She does take something for her reflux we'll put can do that she cannot recall the name for it.   Patient denies any unintentional weight loss, fevers or generalized malaise. She has never had an upper endoscopy.  Patient is a 47 y.o. female presenting with abdominal pain. The history is provided by the patient.  Abdominal Pain This is a recurrent problem. The problem occurs constantly. The problem has not changed since onset.Associated symptoms include abdominal pain. Nothing relieves the symptoms. She has tried nothing for the symptoms.    Past Medical History  Diagnosis Date  . Cancer   . Hypertension    Past Surgical History  Procedure Laterality Date  . Abdominal hysterectomy     History reviewed. No pertinent family history. History  Substance Use Topics  . Smoking status: Never Smoker   . Smokeless tobacco: Not on file  . Alcohol Use: No   OB History   Grav Para Term Preterm Abortions TAB SAB Ect Mult Living                 Review of Systems  Constitutional: Negative for fever, activity change, appetite change and fatigue.  Gastrointestinal:  Positive for abdominal pain. Negative for nausea, vomiting, diarrhea, constipation, blood in stool, abdominal distention, anal bleeding and rectal pain.  Skin: Positive for color change. Negative for rash and wound.    Allergies  Codeine and Iohexol  Home Medications   Current Outpatient Rx  Name  Route  Sig  Dispense  Refill  . ARIPiprazole (ABILIFY) 2 MG tablet   Oral   Take 2 mg by mouth daily.         . simvastatin (ZOCOR) 20 MG tablet   Oral   Take 20 mg by mouth every evening.         . fexofenadine (ALLEGRA) 180 MG tablet   Oral   Take 1 tablet (180 mg total) by mouth daily.   14 tablet   0   . fluticasone (FLONASE) 50 MCG/ACT nasal spray   Nasal   Place 2 sprays into the nose daily.   1 g   1   . Phenylephrine-Chlorphen-DM 06-18-11.5 MG/5ML LIQD   Oral   Take 5 mLs by mouth every 4 (four) hours as needed. Take 5 ml before bedtime prn cough, drainage   120 mL   0    LMP 06/21/2012 Physical Exam  Nursing note and vitals reviewed. Constitutional: She appears well-developed and well-nourished. No distress.  Abdominal: Soft. Bowel sounds are normal. She exhibits no distension and no mass. There is no hepatosplenomegaly, splenomegaly or hepatomegaly. There is tenderness in the epigastric area. There is  no rigidity, no rebound, no guarding, no CVA tenderness, no tenderness at McBurney's point and negative Murphy's sign.  Neurological: She is alert.  Skin: Skin is warm. Rash noted. No abrasion noted. Rash is macular. No erythema. No pallor.       ED Course   Procedures (including critical care time)  Labs Reviewed - No data to display No results found. No diagnosis found.  MDM  Patient presents to urgent care with 2 distinctive complaints   Problem #1 recurrent symptoms consistent with reflux esophagitis. Patient has been instructed to start formally with a daily course of Nexium and to followup with her primary care Dr. in 2 weeks have mentioned to  patient the possibility that if she is not responsive to treatment that she will need a referral to see a gastroenterologist to rule out other esophageal disorders including Barrett's esophagus/esophageal carcinoma. Patient understood my discharge instructions and need for followup in regards to this symptomatology that she reports she's been having for years.   Problem #2 hyperpigmented patch on her epigastric area Of unknown etiology at this point we are performing a random glucose as hyperpigmented region seem to be consistent with nigran.  Jimmie Molly, MD 05/08/13 [phone]

## 2013-05-08 NOTE — ED Notes (Signed)
staes she had GB surgery several years ago, and is concerned about the abdominal wall discoloration she is having off and on for several months; "I think I tore something"

## 2013-11-02 ENCOUNTER — Other Ambulatory Visit: Payer: Self-pay | Admitting: Internal Medicine

## 2013-11-02 DIAGNOSIS — N951 Menopausal and female climacteric states: Secondary | ICD-10-CM

## 2013-11-02 DIAGNOSIS — M810 Age-related osteoporosis without current pathological fracture: Secondary | ICD-10-CM

## 2013-11-09 ENCOUNTER — Other Ambulatory Visit: Payer: Self-pay

## 2013-11-09 DIAGNOSIS — Z1231 Encounter for screening mammogram for malignant neoplasm of breast: Secondary | ICD-10-CM

## 2013-11-14 ENCOUNTER — Ambulatory Visit
Admission: RE | Admit: 2013-11-14 | Discharge: 2013-11-14 | Disposition: A | Discharge status: Home / Self Care | Payer: Medicare Other | Source: Ambulatory Visit | Attending: Internal Medicine | Admitting: Internal Medicine

## 2013-11-14 DIAGNOSIS — N951 Menopausal and female climacteric states: Secondary | ICD-10-CM

## 2013-11-23 ENCOUNTER — Ambulatory Visit
Admission: RE | Admit: 2013-11-23 | Discharge: 2013-11-23 | Disposition: A | Discharge status: Home / Self Care | Payer: Medicaid Other | Source: Ambulatory Visit

## 2013-11-23 DIAGNOSIS — Z1231 Encounter for screening mammogram for malignant neoplasm of breast: Secondary | ICD-10-CM

## 2014-11-15 ENCOUNTER — Other Ambulatory Visit: Payer: Self-pay

## 2014-11-15 DIAGNOSIS — Z1231 Encounter for screening mammogram for malignant neoplasm of breast: Secondary | ICD-10-CM

## 2014-11-27 ENCOUNTER — Ambulatory Visit
Admission: RE | Admit: 2014-11-27 | Discharge: 2014-11-27 | Disposition: A | Discharge status: Home / Self Care | Payer: Medicare Other | Source: Ambulatory Visit

## 2014-11-27 DIAGNOSIS — Z1231 Encounter for screening mammogram for malignant neoplasm of breast: Secondary | ICD-10-CM

## 2015-06-14 ENCOUNTER — Other Ambulatory Visit: Payer: Self-pay | Admitting: Gastroenterology

## 2015-06-26 ENCOUNTER — Other Ambulatory Visit: Payer: Self-pay | Admitting: Neurology

## 2015-06-26 MED ORDER — MONTELUKAST SODIUM 10 MG PO TABS: 10.0000 mg | ORAL_TABLET | Freq: Every day | ORAL | Status: DC

## 2015-06-26 MED ORDER — ALBUTEROL SULFATE HFA 108 (90 BASE) MCG/ACT IN AERS: 2.0000 | INHALATION_SPRAY | RESPIRATORY_TRACT | Status: DC | PRN

## 2015-08-19 ENCOUNTER — Other Ambulatory Visit: Payer: Self-pay | Admitting: Allergy and Immunology

## 2015-08-23 ENCOUNTER — Encounter: Payer: Self-pay | Admitting: Allergy and Immunology

## 2015-08-23 ENCOUNTER — Ambulatory Visit (INDEPENDENT_AMBULATORY_CARE_PROVIDER_SITE_OTHER): Payer: Medicare Other | Admitting: Allergy and Immunology

## 2015-08-23 VITALS — BP 136/90 | HR 72 | Temp 98.2°F | Resp 16

## 2015-08-23 DIAGNOSIS — H1010 Acute atopic conjunctivitis, unspecified eye: Secondary | ICD-10-CM

## 2015-08-23 DIAGNOSIS — J309 Allergic rhinitis, unspecified: Secondary | ICD-10-CM

## 2015-08-23 DIAGNOSIS — R05 Cough: Secondary | ICD-10-CM | POA: Diagnosis not present

## 2015-08-23 DIAGNOSIS — R059 Cough, unspecified: Secondary | ICD-10-CM

## 2015-08-23 MED ORDER — MONTELUKAST SODIUM 10 MG PO TABS: ORAL_TABLET | ORAL | Status: DC

## 2015-08-23 MED ORDER — ALBUTEROL SULFATE HFA 108 (90 BASE) MCG/ACT IN AERS: INHALATION_SPRAY | RESPIRATORY_TRACT | Status: DC

## 2015-08-23 NOTE — Progress Notes (Addendum)
FOLLOW UP NOTE  RE: Carly Simmons MRN: [id number] DOB: 10/10/1965 [facility] [place] [address] [place] [address] Date of Office Visit: 08/23/2015  Subjective:  Carly Simmons is a 49 y.o. female who presents today for Follow-up  Assessment:   1. Cough   2. Allergic rhinoconjunctivitis   3.      Report of tongue irritation--concern for food allergy. Plan:   Meds ordered this encounter  Medications  . albuterol (PROAIR HFA) 108 (90 BASE) MCG/ACT inhaler    Sig: USE 2 PUFFS EVERY 4 HOURS AS NEEDED FOR COUGH OR WHEEZE.  MAY USE 2 PUFFS 10-20 MINUTES PRIOR TO EXERCISE.    Dispense:  1 Inhaler    Refill:  1  . montelukast (SINGULAIR) 10 MG tablet    Sig: TAKE ONE EACH EVENING TO PREVENT COUGH OR WHEEZE.    Dispense:  30 tablet    Refill:  5   Patient Instructions   1.  Continue current medication regime and saline nasal wash each evening and as needed. 2.  Continue avoidance of tomato, orange, pineapple as discussed. 3.  Plan for selected labs at [facility] based on patient's list. 4.  Keep diary of any new tongue or lip episodes as discussed and take pictures. 5.  Follow-up in 6 months or sooner if needed.   HPI: Carly Simmons returns to the office in follow-up of allergic rhinitis, cough and concern for food sensitivity.  Since her last visit in September, she feels the maintenance medications have definitely been beneficial, though she recently had a "cold with congestion, postnasal drip, throat irritation and elevated temp.  She did use her ProAir on a few occasions but now feels she is significantly improved and denies any recurring symptoms, wheezing, shortness of breath or difficulty breathing.  There have been no disrupted sleep or activity, nor  emergency department or urgent care visits, antibiotics or prednisone courses.  She had completed a list of various foods in question for her but did not bring them to the visit today.  Continues to avoid  tomato, orange and pineapple but thought there may have been an episode or 2 of throat/tongue irritation without swelling, difficulty breathing, dysphagia or other oral changes.  Current Medications: 1.  ProAir HFA as needed. 2.  Singular 10 mg once daily. 3.  Flonase 1-2 sprays once daily. 4.  Continues Abilify, Nexium, Zocor, and phentermine.   5.  As needed Mucinex day and night cold.  Drug Allergies: Allergies  Allergen Reactions  . Codeine     [name]  . Iohexol Hives  . Penicillins Rash    Objective:   Filed Vitals:   08/23/15 1419  BP: 136/90  Pulse: 72  Temp: 98.2 F (36.8 C)  Resp: 16   Physical Exam  Constitutional: She is well-developed, well-nourished, and in no distress.  HENT:  Head: Atraumatic.  Right Ear: Tympanic membrane and ear canal normal.  Left Ear: Tympanic membrane and ear canal normal.  Nose: Mucosal edema present. No rhinorrhea. No epistaxis.  Mouth/Throat: Mucous membranes are normal. No oropharyngeal exudate (tongue symmetric pink midline without irritation or lesions.), posterior oropharyngeal edema or posterior oropharyngeal erythema.  Neck: Neck supple.  Cardiovascular: Normal rate, S1 normal and S2 normal.   No murmur heard. Pulmonary/Chest: Effort normal. She has no wheezes. She has no rhonchi. She has no rales.  Lymphadenopathy:    She has no cervical adenopathy.    Diagnostics: Spirometry:  FVC  2.17--68%, FEV1 1.89--78%.    Ionia Schey M. Ishmael Holter, MD  cc: Philis Fendt, MD

## 2015-08-23 NOTE — Patient Instructions (Addendum)
   Continue current medication regime.  Continue avoidance to tomato, orange, pineapple as discussed.  Plan for selected labs at [facility] based on patient's list.  Keep diary of any new tongue or lip episodes as discussed and take pictures.  Follow-up in 6 months or sooner if needed.

## 2015-10-23 ENCOUNTER — Other Ambulatory Visit: Payer: Self-pay

## 2015-10-23 DIAGNOSIS — Z1231 Encounter for screening mammogram for malignant neoplasm of breast: Secondary | ICD-10-CM

## 2015-11-28 ENCOUNTER — Ambulatory Visit
Admission: RE | Admit: 2015-11-28 | Discharge: 2015-11-28 | Disposition: A | Discharge status: Home / Self Care | Payer: Medicare Other | Source: Ambulatory Visit

## 2015-11-28 DIAGNOSIS — Z1231 Encounter for screening mammogram for malignant neoplasm of breast: Secondary | ICD-10-CM

## 2015-12-20 ENCOUNTER — Ambulatory Visit (INDEPENDENT_AMBULATORY_CARE_PROVIDER_SITE_OTHER): Payer: Medicare Other | Admitting: Allergy and Immunology

## 2015-12-20 ENCOUNTER — Encounter: Payer: Self-pay | Admitting: Allergy and Immunology

## 2015-12-20 VITALS — BP 126/80 | HR 74 | Temp 98.3°F | Resp 16

## 2015-12-20 DIAGNOSIS — R05 Cough: Secondary | ICD-10-CM

## 2015-12-20 DIAGNOSIS — R059 Cough, unspecified: Secondary | ICD-10-CM

## 2015-12-20 DIAGNOSIS — J309 Allergic rhinitis, unspecified: Secondary | ICD-10-CM

## 2015-12-20 DIAGNOSIS — L509 Urticaria, unspecified: Secondary | ICD-10-CM

## 2015-12-20 DIAGNOSIS — K130 Diseases of lips: Secondary | ICD-10-CM | POA: Diagnosis not present

## 2015-12-20 DIAGNOSIS — H1010 Acute atopic conjunctivitis, unspecified eye: Secondary | ICD-10-CM

## 2015-12-20 DIAGNOSIS — R220 Localized swelling, mass and lump, head: Secondary | ICD-10-CM

## 2015-12-20 MED ORDER — EPINEPHRINE 0.3 MG/0.3ML IJ SOAJ: INTRAMUSCULAR | Status: DC

## 2015-12-20 NOTE — Progress Notes (Signed)
FOLLOW UP NOTE  RE: Carly Simmons MRN: [id number] DOB: [birth year] [facility] [place] [address] [place] [address] Date of Office Visit: 12/20/2015  Subjective:  Carly Simmons is a 50 y.o. female who presents today for Rash  Assessment:   1. Hives, suspected with question of component of dermatographism   2. Allergic rhinoconjunctivitis and history of cough.   3. Previous concern for Lip swelling without recent difficulty.   4.      Patient concern for food allergy, with previous negative, selective skin prick testing. Plan:   Meds ordered this encounter  Medications  . EPINEPHrine 0.3 mg/0.3 mL IJ SOAJ injection    Sig: Use as directed for a severe allergic reaction.    Dispense:  4 Device    Refill:  2   Patient Instructions  1.   Continue avoidance of concerning foods as previously discussed.   2.   Epi-pen/benadryl as needed. 3.   Obtain specific IgE for selected fruits, nuts, shellfish at [facility]. 4.   Use Zyrtec syrup 1 teaspoon daily --given previous concern for adult dose being too drying.   5.   ProAir HFA as needed.   6.   Continue Singulair and Flonase daily. 7.   Use fragrance free soap, lotion, detergent.   8.   Follow-up via phone with lab results, therwise in 4-6 months or sooner if needed.    HPI: Carly Simmons returns to the office in follow-up of allergic rhinoconjunctivitis and cough.  As discussed at her previous visit, including last December 2016, she wondered about food sensitivities, given previous tongue irritation and now recent itching, redness/skin changes during the last week.  She describes over the last 4 days, red, pruritic areas-- possible hives at her upper extremities, chest and abdomen, without a specific exposure ingestion, environment or activity.  She had modified her eating pattern related to a 21 day diet and taking in more fruits, vegetables, nuts, smoothies, but does not feel these are triggers to her  difficulty.  In the past, she had wondered about pineapple/spicy foods and shellfish with skin prick testing negative.  she reported taking Benadryl 25 mg and use hydrocortisone cream with great relief and no current difficulty today.  Denies ED or urgent care visits, prednisone or antibiotic courses. Reports sleep and activity are normal.  Feels her breathing is doing very well and denies any recent albuterol use and minimal nasal congestion, without headache, sore throat, postnasal drip, sneezing nor itchy watery eyes, given current fluctuant weather patterns and pollen season.    Carly Simmons has a current medication list which includes the following prescription(s): albuterol, diphenhydramine, esomeprazole, fluticasone, montelukast, simvastatin.  Drug Allergies: Allergies  Allergen Reactions  . Codeine     [name]  . Iohexol Hives  . Penicillins Rash   Objective:   Filed Vitals:   12/20/15 0954  BP: 126/80  Pulse: 74  Temp: 98.3 F (36.8 C)  Resp: 16   SpO2 Readings from Last 1 Encounters:  12/20/15 98%   Physical Exam  Constitutional: She is well-developed, well-nourished, and in no distress.  HENT:  Head: Atraumatic.  Right Ear: Tympanic membrane and ear canal normal.  Left Ear: Tympanic membrane and ear canal normal.  Nose: Mucosal edema present. No rhinorrhea. No epistaxis.  Mouth/Throat: Oropharynx is clear and moist and mucous membranes are normal. No oropharyngeal exudate, posterior oropharyngeal edema or posterior oropharyngeal erythema.  Neck: Neck supple.  Cardiovascular:  Normal rate, S1 normal and S2 normal.   No murmur heard. Pulmonary/Chest: Effort normal. She has no wheezes. She has no rhonchi. She has no rales.  Lymphadenopathy:    She has no cervical adenopathy.  Skin: Skin is warm. No rash noted. No erythema.  No dermatographia or hives.   Diagnostics: Spirometry:  FVC 2.46--- 80%, FEV1 2.02--- 81%.    Carly Simmons M. Ishmael Holter, MD  cc:  Philis Fendt, MD

## 2015-12-20 NOTE — Patient Instructions (Addendum)
  1.   Continue avoidance of concerning foods as previously discussed.    2.   Epi-pen/benadryl as needed.  3.   Obtain labs at [facility].  4.   Use Zyrtec syrup 1 teaspoon daily.    5.   ProAir HFA as needed.    6.   Continue Singulair and Flonase daily.  7.   Use fragrance free soap, lotion, detergent.    8.   Follow-up via phone with lab results, otherwise in 4-6 months or sooner if needed.

## 2015-12-23 LAB — ALLERGEN PISTACHIO F203: Pistachio  IgE: <0.1 kU/L

## 2015-12-23 LAB — ALLERGEN, RASPBERRY, RF343: Allergen, Raspberry, Rf343: <0.1 kU/L

## 2015-12-23 LAB — ALLERGEN, ORANGE F33: Orange: <0.1 kU/L

## 2015-12-23 LAB — ALLERGY PANEL 18, NUT MIX GROUP
Almonds: <0.1 kU/L
Cashew IgE: <0.1 kU/L
Coconut: <0.1 kU/L
Hazelnut: <0.1 kU/L
Peanut IgE: <0.1 kU/L
Pecan Nut: <0.1 kU/L
Sesame Seed f10: <0.1 kU/L

## 2015-12-23 LAB — ALLERGEN BANANA: Allergen Banana f92: <0.1 kU/L

## 2015-12-23 LAB — ALLERGY-SHELLFISH PANEL
Clams: <0.1 kU/L
Crab: <0.1 kU/L
Lobster: <0.1 kU/L
Shrimp IgE: 0.24 kU/L — ABNORMAL HIGH

## 2015-12-23 LAB — ALLERGEN, PINEAPPLE, F210: Allergen, Pineapple, f210: <0.1 kU/L

## 2015-12-23 LAB — ALLERGEN WALNUT F256: Walnut: <0.1 kU/L

## 2015-12-23 LAB — ALLERGEN, APPLE F49: Apple: <0.1 kU/L

## 2015-12-23 LAB — ALLERGEN, STRAWBERRY, F44: Allergen, Strawberry, f44: <0.1 kU/L

## 2015-12-23 LAB — ALLERGEN GRAPE F259: Grape f259: <0.1 kU/L

## 2015-12-23 LAB — ALLERGEN, CINNAMON, RF220: Allergen, Cinnamon, Rf220: <0.1 kU/L

## 2015-12-23 LAB — ALLERGEN, BRAZIL NUT, F18: Brazil Nut: <0.1 kU/L

## 2015-12-23 LAB — ALLERGEN, GINGER, RF270: Allergen, Ginger, Rf270: <0.1 kU/L

## 2015-12-23 LAB — ALLERGEN, MANGO, F91: Mango kU/L: <0.1

## 2015-12-23 LAB — ALLERGEN, BLUEBERRY, RF288: Allergen, Blueberry, Rf288: <0.1 kU/L

## 2015-12-25 LAB — ALLERGEN, TURMERIC, IGE
Class: 0
Turmeric IgE*: <0.35 kU/L (ref <0.35)

## 2016-02-12 ENCOUNTER — Telehealth: Payer: Self-pay | Admitting: Allergy and Immunology

## 2016-02-12 NOTE — Telephone Encounter (Signed)
She has MCR and MCD but one of them apparently hasn't paid and she wants to know why. Can you please check this and give her a call back. If she doesn't answer please leave a message.

## 2016-02-12 NOTE — Telephone Encounter (Signed)
FILED MCD - TOLD PT

## 2016-06-17 ENCOUNTER — Other Ambulatory Visit: Payer: Self-pay

## 2016-06-17 MED ORDER — MONTELUKAST SODIUM 10 MG PO TABS: ORAL_TABLET | ORAL | 5 refills | Status: DC

## 2016-07-20 ENCOUNTER — Ambulatory Visit (INDEPENDENT_AMBULATORY_CARE_PROVIDER_SITE_OTHER): Payer: Medicare HMO | Admitting: Allergy & Immunology

## 2016-07-20 ENCOUNTER — Encounter: Payer: Self-pay | Admitting: Allergy & Immunology

## 2016-07-20 ENCOUNTER — Encounter (INDEPENDENT_AMBULATORY_CARE_PROVIDER_SITE_OTHER): Payer: Self-pay

## 2016-07-20 VITALS — BP 158/90 | HR 76 | Temp 98.4°F | Resp 12

## 2016-07-20 DIAGNOSIS — J302 Other seasonal allergic rhinitis: Secondary | ICD-10-CM

## 2016-07-20 DIAGNOSIS — T781XXD Other adverse food reactions, not elsewhere classified, subsequent encounter: Secondary | ICD-10-CM | POA: Diagnosis not present

## 2016-07-20 DIAGNOSIS — J4520 Mild intermittent asthma, uncomplicated: Secondary | ICD-10-CM

## 2016-07-20 MED ORDER — FLUTICASONE PROPIONATE 50 MCG/ACT NA SUSP: 2.0000 | Freq: Every day | NASAL | 5 refills | Status: DC

## 2016-07-20 MED ORDER — EPINEPHRINE 0.3 MG/0.3ML IJ SOAJ: INTRAMUSCULAR | 1 refills | Status: DC

## 2016-07-20 MED ORDER — MONTELUKAST SODIUM 10 MG PO TABS: ORAL_TABLET | ORAL | 5 refills | Status: DC

## 2016-07-20 NOTE — Patient Instructions (Signed)
1. Adverse food reaction (shellfish) - Allergy levels were low enough for shrimp that we could do a challenge in the clinic setting. - You will need to bring in some shrimp for the test (boiled without other seasonings). - This visit will take 1.5 to 2 hours total.  2. Mild intermittent asthma, uncomplicated - Continue with Singulair daily, which can help with both allergies and asthma.  - Continue with ProAir four puffs at needed.  3. Other seasonal allergic rhinitis - Continue with Singulair daily. - Continue with Flonase as needed.  4. Return in about 3 months (around 10/20/2016) for Gisela.  Please inform us of any Emergency Department visits, hospitalizations, or changes in symptoms. Call us before going to the ED for breathing or allergy symptoms since we might be able to fit you in for a sick visit. Feel free to contact us anytime with any questions, problems, or concerns.  It was a pleasure to meet you today!   Websites that have reliable patient information: 1. American Academy of Asthma, Allergy, and Immunology: www.aaaai.org 2. Food Allergy Research and Education (FARE): foodallergy.org 3. Mothers of Asthmatics: http://www.asthmacommunitynetwork.org 4. American College of Allergy, Asthma, and Immunology: www.acaai.org

## 2016-07-20 NOTE — Progress Notes (Signed)
FOLLOW UP  Date of Service/Encounter:  07/20/16   Assessment:   Adverse food reaction, subsequent encounter  Mild intermittent asthma, uncomplicated  Other seasonal allergic rhinitis   Asthma Reportables:  Severity: intermittent  Risk: low Control: well controlled  Seasonal Influenza Vaccine: yes      Plan/Recommendations:   1. Adverse food reaction (shellfish) - Allergy levels were low enough for shrimp that we could do a challenge in the clinic setting. - You will need to bring in some shrimp for the test (boiled without other seasonings). - This visit will take 1.5 to 2 hours total.  2. Mild intermittent asthma, uncomplicated - Continue with Singulair daily, which can help with both allergies and asthma.  - Continue with ProAir four puffs at needed.  3. Other seasonal allergic rhinitis - Continue with Singulair daily. - Continue with Flonase as needed.  4. Return in about 3 months (around 10/20/2016) for [place].    Subjective:   Alba Twedt is a 50 y.o. female presenting today for follow up of  Chief Complaint  Patient presents with  . Allergic Rhinitis     Xaylee Hirose has a history of the following: There are no active problems to display for this patient.   History obtained from: chart review and patient.  Shareta Kuenzi was referred by Philis Fendt, MD.     Kerah is a 50 y.o. female presenting for a follow up visit. She was previously followed by Dr. Ishmael Holter, who has since left the practice. At that time, multiple labs ordered for fruits, nuts, and shellfish. He was recommended that she use cetirizine 1 teaspoon daily, pro-air when necessary, Singulair, and Flonase. Review of her previous notes shows that her food reactions were rather vague including skin findings that were not always consistent. It doesn't seem that she ever had systemic reaction aside from skin involvement. She did have multiple labs performed which were all  negative aside from a shrimp IgE of 0.24. We did offer her a shrimp challenge and she was interested, however she did not bring in treatment today for the challenge.  Since the last visit, she has done well. Currently, she is only taking Singulair daily. She is not taking any antihistamines whatsoever. She does have Flonase which she uses as needed. She reports that her testing was positive to grasses and ragweed, although she is unsure what the remainder of the testing showed. Malkia's asthma has been well controlled. She has not required rescue medication, experienced nocturnal awakenings due to lower respiratory symptoms, nor have activities of daily living been limited. She has never needed her ProAir since the last visit. She has required no ER visits or courses of prednisone for her asthma.  Currently she is avoiding shellfish. She did introduce peanuts back into her diet and has introduced some tree nuts into her diet. She prefers more natural approaches to her health issues. She drinks water, ginger, and garlic every night to help with circulation. She uses tumeric for inflammation. She is on a blood pressure medication but has not been taking it recently because she works second shift, and apparently her medication is supposed to be taken 2 hours before bedtime. 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   Otherwise, there have been no changes to her past medical history, surgical history, family history, or social history. Kieya has 3 children and 11 great grandchildren.    Review of Systems: a 14-point  review of systems is pertinent for what is mentioned in HPI.  Otherwise, all other systems were negative. Constitutional: negative other than that listed in the HPI Eyes: negative other than that listed in the HPI Ears, nose, mouth, throat, and face: negative other than that listed in the HPI Respiratory: negative other than that listed in  the HPI Cardiovascular: negative other than that listed in the HPI Gastrointestinal: negative other than that listed in the HPI Genitourinary: negative other than that listed in the HPI Integument: negative other than that listed in the HPI Hematologic: negative other than that listed in the HPI Musculoskeletal: negative other than that listed in the HPI Neurological: negative other than that listed in the HPI Allergy/Immunologic: negative other than that listed in the HPI    Objective:   Blood pressure (!) 158/90, pulse 76, temperature 98.4 F (36.9 C), temperature source Oral, resp. rate 12, last menstrual period 06/21/2012, SpO2 96 %. There is no height or weight on file to calculate BMI.   Physical Exam:  General: Alert, interactive, in no acute distress. Cooperative with the exam. Very pleasant female. HEENT: TMs pearly gray, turbinates minimally edematous without discharge, post-pharynx mildly erythematous. Neck: Supple without thyromegaly. Lungs: Clear to auscultation without wheezing, rhonchi or rales. No increased work of breathing. CV: Normal S1/S2, no murmurs. Capillary refill <2 seconds.  Abdomen: Nondistended, nontender. No guarding or rebound tenderness. Bowel sounds faint and present in all fields  Skin: Warm and dry, without lesions or rashes. Extremities:  No clubbing, cyanosis or edema. Neuro:   Grossly intact. No focal findings.  Diagnostic studies: None    Salvatore Marvel, MD [place] [facility] [place]

## 2016-09-28 ENCOUNTER — Ambulatory Visit (HOSPITAL_COMMUNITY)
Admission: EM | Admit: 2016-09-28 | Discharge: 2016-09-28 | Disposition: A | Discharge status: Home / Self Care | Payer: Medicare HMO | Attending: Family Medicine | Admitting: Family Medicine

## 2016-09-28 ENCOUNTER — Encounter (HOSPITAL_COMMUNITY): Payer: Self-pay | Admitting: Emergency Medicine

## 2016-09-28 DIAGNOSIS — R059 Cough, unspecified: Secondary | ICD-10-CM

## 2016-09-28 DIAGNOSIS — J208 Acute bronchitis due to other specified organisms: Secondary | ICD-10-CM | POA: Diagnosis not present

## 2016-09-28 DIAGNOSIS — R05 Cough: Secondary | ICD-10-CM

## 2016-09-28 MED ORDER — BENZONATATE 100 MG PO CAPS: 200.0000 mg | ORAL_CAPSULE | Freq: Three times a day (TID) | ORAL | 0 refills | Status: DC | PRN

## 2016-09-28 MED ORDER — AZITHROMYCIN 250 MG PO TABS: 250.0000 mg | ORAL_TABLET | Freq: Every day | ORAL | 0 refills | Status: DC

## 2016-09-28 MED ORDER — METHYLPREDNISOLONE 4 MG PO TBPK: ORAL_TABLET | ORAL | 0 refills | Status: DC

## 2016-09-28 NOTE — ED Triage Notes (Signed)
The patient presented to the [facility] with a complaint of a headache and cough x 5 days.

## 2016-09-28 NOTE — ED Provider Notes (Signed)
CSN: [id number]     Arrival date & time 09/28/16  1836 History   None    Chief Complaint  Patient presents with  . Cough   (Consider location/radiation/quality/duration/timing/severity/associated sxs/prior Treatment) Patient c/o cough and URI sx's along with headache due to cough for 5 days.   The history is provided by the patient.  Cough  Cough characteristics:  Productive Sputum characteristics:  Yellow Severity:  Severe Onset quality:  Sudden Duration:  5 days Timing:  Constant Chronicity:  New Smoker: no   Context: upper respiratory infection and weather changes   Relieved by:  Cough suppressants Worsened by:  Nothing Associated symptoms: headaches and sore throat     Past Medical History:  Diagnosis Date  . Cancer ([place])   . Hypertension    Past Surgical History:  Procedure Laterality Date  . ABDOMINAL HYSTERECTOMY     History reviewed. No pertinent family history. Social History  Substance Use Topics  . Smoking status: Former Smoker    Packs/day: 2.00    Types: Cigarettes    Quit date: 09/14/1988  . Smokeless tobacco: Former Systems developer  . Alcohol use No   OB History    No data available     Review of Systems  Constitutional: Positive for fatigue.  HENT: Positive for sore throat.   Eyes: Negative.   Respiratory: Positive for cough.   Cardiovascular: Negative.   Gastrointestinal: Negative for abdominal distention.  Endocrine: Negative.   Musculoskeletal: Positive for arthralgias.  Neurological: Positive for headaches.    Allergies  Codeine; Iohexol; and Penicillins  Home Medications   Prior to Admission medications   Medication Sig Start Date End Date Taking? Authorizing Provider  albuterol (PROAIR HFA) 108 (90 BASE) MCG/ACT inhaler USE 2 PUFFS EVERY 4 HOURS AS NEEDED FOR COUGH OR WHEEZE.  MAY USE 2 PUFFS 10-20 MINUTES PRIOR TO EXERCISE. 08/23/15  Yes Roselyn Malachy Moan, MD  fluticasone (FLONASE) 50 MCG/ACT nasal spray Place 2 sprays into both nostrils  daily. 07/20/16  Yes Valentina Shaggy, MD  hydrochlorothiazide (MICROZIDE) 12.5 MG capsule Take 12.5 mg by mouth daily.   Yes Historical Provider, MD  montelukast (SINGULAIR) 10 MG tablet TAKE ONE EACH EVENING TO PREVENT COUGH OR WHEEZE. 07/20/16  Yes Valentina Shaggy, MD  omeprazole (PRILOSEC) 20 MG capsule Take 20 mg by mouth daily.   Yes Historical Provider, MD  PE-Diphenhydramine-DM-GG-APAP [facility] CHILD MS DAY-NIGHT CLD PO) Take 10 mLs by mouth at bedtime as needed. Reported on 12/20/2015   Yes Historical Provider, MD  azithromycin (ZITHROMAX) 250 MG tablet Take 1 tablet (250 mg total) by mouth daily. Take first 2 tablets together, then 1 every day until finished. 09/28/16   Lysbeth Penner, FNP  benzonatate (TESSALON) 100 MG capsule Take 2 capsules (200 mg total) by mouth 3 (three) times daily as needed for cough. 09/28/16   Lysbeth Penner, FNP  methylPREDNISolone (MEDROL DOSEPAK) 4 MG TBPK tablet Take 6-5-4-3-2-1 po qd 09/28/16   Lysbeth Penner, FNP   Meds Ordered and Administered this Visit  Medications - No data to display  BP 146/88 (BP Location: Left Arm)   Pulse 79   Temp 97.9 F (36.6 C) (Oral)   Resp 16   LMP 06/21/2012   SpO2 98%  No data found.   Physical Exam  Constitutional: She appears well-developed and well-nourished.  HENT:  Head: Normocephalic and atraumatic.  Right Ear: External ear normal.  Left Ear: External ear normal.  Nose: Nose normal.  Mouth/Throat: Oropharynx  is clear and moist.  Eyes: Conjunctivae and EOM are normal. Pupils are equal, round, and reactive to light.  Neck: Normal range of motion. Neck supple.  Cardiovascular: Normal rate, regular rhythm and normal heart sounds.   Pulmonary/Chest: Effort normal. She has wheezes.  Abdominal: Bowel sounds are normal.  Nursing note and vitals reviewed.   Urgent Care Course   Clinical Course     Procedures (including critical care time)  Labs Review Labs Reviewed - No data to  display  Imaging Review No results found.   Visual Acuity Review  Right Eye Distance:   Left Eye Distance:   Bilateral Distance:    Right Eye Near:   Left Eye Near:    Bilateral Near:         MDM   1. Acute bronchitis due to other specified organisms   2. Cough    zpak Medrol dose pack Tessalon perles  Push po fluids, rest, tylenol and motrin otc prn as directed for fever, arthralgias, and myalgias.  Follow up prn if sx's continue or persist.    Lysbeth Penner, FNP 09/28/16 2038

## 2016-10-29 ENCOUNTER — Other Ambulatory Visit: Payer: Self-pay | Admitting: Internal Medicine

## 2016-10-29 DIAGNOSIS — Z1231 Encounter for screening mammogram for malignant neoplasm of breast: Secondary | ICD-10-CM

## 2016-11-23 ENCOUNTER — Ambulatory Visit (INDEPENDENT_AMBULATORY_CARE_PROVIDER_SITE_OTHER): Payer: Medicare HMO | Admitting: Allergy & Immunology

## 2016-11-23 ENCOUNTER — Encounter (INDEPENDENT_AMBULATORY_CARE_PROVIDER_SITE_OTHER): Payer: Self-pay

## 2016-11-23 ENCOUNTER — Encounter: Payer: Self-pay | Admitting: Allergy & Immunology

## 2016-11-23 VITALS — BP 138/90 | HR 62 | Temp 98.3°F | Resp 16

## 2016-11-23 DIAGNOSIS — J4540 Moderate persistent asthma, uncomplicated: Secondary | ICD-10-CM

## 2016-11-23 DIAGNOSIS — J302 Other seasonal allergic rhinitis: Secondary | ICD-10-CM

## 2016-11-23 DIAGNOSIS — T781XXD Other adverse food reactions, not elsewhere classified, subsequent encounter: Secondary | ICD-10-CM

## 2016-11-23 NOTE — Progress Notes (Signed)
FOLLOW UP  Date of Service/Encounter:  11/23/16   Assessment:   Adverse food reaction (shellfish)  Moderate persistent asthma, uncomplicated  Seasonal allergic rhinitis   Asthma Reportables:  Severity: moderate persistent  Risk: medium Control: not well controlled  Seasonal Influenza Vaccine: yes   Plan/Recommendations:   1. Adverse food reaction (shrimp) - Ms. Roh failed the shrimp challenge today. - I recommended that she continue to avoid shrimp indefinitely. - This seems to be isolated to shrimp only, as she does eat lobster and crab fairly often. - I did discuss the risk of cross contamination and emphasized the need to carry her epinephrine with her at all times. - We did treat her acute reaction today with cetirizine 10 mg in conjunction with the dose of prednisone. - We sent her home with prednisone 20 mg twice a day for 48 hours to decrease the risk of biphasic reactions. - EpiPen is up-to-date.  2. Moderate persistent asthma, uncomplicated - Lung testing was slightly lower compared to the last visit, at which time it was completely normal. - She did have significant improvement with nebulizer treatment. Therefore I feel that her symptoms warrant a daily controller medication in addition to her Singulair. - We will start Symbicort 160/4.5 - You did improve with albuterol nebulizer. - Therefore I think it would be helpful to start a daily controller medication: Symbicort 160/4.5 two puffs twice daily - Daily controller medication(s): Symbicort 160/4.5 two puffs twice daily + Singulair 10mg  daily - Rescue medications: ProAir 4 puffs every 4-6 hours as needed - Asthma control goals:  * Full participation in all desired activities (may need albuterol before activity) * Albuterol use two time or less a week on average (not counting use with activity) * Cough interfering with sleep two time or less a month * Oral steroids no more than once a year * No  hospitalizations  3. Seasonal allergic rhinitis - Continue with Singulair 10mg  daily.  4. Return in about 4 weeks (around 12/21/2016).   Subjective:   Carly Simmons is a 51 y.o. female presenting today for follow up of  Chief Complaint  Patient presents with  . Allergic Reaction    Shrimp Challenge  . Asthma    No recent flare    Carly Simmons has a history of the following: There are no active problems to display for this patient.   History obtained from: chart review and patient.  Carly Simmons was referred by Philis Fendt, MD.     Carly Simmons is a 51 y.o. female presenting for a shrimp challenge. She was last seen in November 2017. At that time, she was doing well from an asthma perspective. We continued her on Singulair daily as well as prior as needed. For her allergies, we continued her on Flonase as needed addition to Singulair. Her shrimp IgE was 0.24, and she had previously tolerated this procedure to the diagnosis. Therefore we recommended that she come in for a shrimp challenge.  Since last visit, she has done well. She did bring in shrimp today for the challenge. It has not been season with any spices whatsoever. Her original reaction is rather unknown because she originally presented with tongue swelling. Subsequent testing revealed a positive shrimp skin test. She has not since that time that it might be related to old [employer], which she has meticulously avoided. She has had some accidental trip exposures since the diagnosis, but has not eaten a large amount and has been fine.  From an asthma perspective, she has remained stable. She did have an episode of bronchitis in early January after being exposed to swimming pool chemicals. She was seen in urgent care and started on a Medrol Dosepak as well as azithromycin. She does feel that she is recovered from this. However, she does endorse shortness of breath and uses her albuterol inhaler approximately every other day.  Her car recently broke down, and she has been walking more and using the public transportation system. She has noticed decreased endurance during this time. She has had a diagnosis of asthma for at least 1 decade, possibly longer. He was on an inhaled steroid at some point, but she cannot remember when. She knows she was on Advair at some point as well. She denies nighttime coughing or wheezing but instead endorses increased symptoms during the day time hours.  Otherwise, there have been no changes to her past medical history, surgical history, family history, or social history.    Review of Systems: a 14-point review of systems is pertinent for what is mentioned in HPI.  Otherwise, all other systems were negative. Constitutional: negative other than that listed in the HPI Eyes: negative other than that listed in the HPI Ears, nose, mouth, throat, and face: negative other than that listed in the HPI Respiratory: negative other than that listed in the HPI Cardiovascular: negative other than that listed in the HPI Gastrointestinal: negative other than that listed in the HPI Genitourinary: negative other than that listed in the HPI Integument: negative other than that listed in the HPI Hematologic: negative other than that listed in the HPI Musculoskeletal: negative other than that listed in the HPI Neurological: negative other than that listed in the HPI Allergy/Immunologic: negative other than that listed in the HPI    Objective:   Blood pressure 138/90, pulse 62, temperature 98.3 F (36.8 C), temperature source Oral, resp. rate 16, last menstrual period 06/21/2012, SpO2 97 %. There is no height or weight on file to calculate BMI.   Physical Exam:  General: Alert, interactive, in no acute distress. Cooperative with the exam. Pleasant.  Eyes: No conjunctival injection present on the right, No conjunctival injection present on the left, PERRL bilaterally, No discharge on the right, No  discharge on the left and No Horner-Trantas dots present Ears: Right TM pearly gray with normal light reflex, Left TM pearly gray with normal light reflex, Right TM intact without perforation and Left TM intact without perforation.  Nose/Throat: External nose within normal limits, nasal crease present and septum midline, turbinates edematous and pale with clear discharge, post-pharynx mildly erythematous without cobblestoning in the posterior oropharynx. Tonsils 2+ without exudates Neck: Supple without thyromegaly. Lungs: Clear to auscultation without wheezing, rhonchi or rales. No increased work of breathing. CV: Normal S1/S2, no murmurs. Capillary refill <2 seconds.  Skin: Warm and dry, without lesions or rashes. Neuro:   Grossly intact. No focal deficits appreciated. Responsive to questions.   Diagnostic studies:  Spirometry: results abnormal (FEV1: 1.60/65%, FVC: 2.04/67%, FEV1/FVC: 78%).    Spirometry consistent with possible restrictive disease. Albuterol/Atrovent nebulizer treatment given in clinic with significant improvement in the forced vital capacity. It increased 600 mL (29%). The forced expiratory volume in 1 second increased to 160 mL (11%)..  Allergy Studies: None  The patient had negative skin testing and very low IgE and was NOT able to tolerate the open graded oral challenge today without adverse signs or symptoms. Although her vitals remained stable during the challenge, she  experienced worsening lip itching and tongue swelling. Therefore, we halted the challenge after the 1 gram dose. We gave cetirizine 10mg  in addition to prednisone 20mg .     Salvatore Marvel, MD [facility] [place]

## 2016-11-23 NOTE — Patient Instructions (Addendum)
1. Adverse food reaction (shrimp) - You failed the shrimp challenge today. - Continue to avoid indefinitely. - Take prednisone 20mg  twice daily for the next 48 hours (pills provided). - EpiPen is up to date.   2. Moderate persistent asthma, uncomplicated - Lung testing was slightly lower compared to the last visit. - You did improve with albuterol nebulizer. - Therefore I think it would be helpful to start a daily controller medication: Symbicort 160/4.5 two puffs twice daily - Daily controller medication(s): Symbicort 160/4.5 two puffs twice daily + Singulair 10mg  daily - Rescue medications: ProAir 4 puffs every 4-6 hours as needed - Asthma control goals:  * Full participation in all desired activities (may need albuterol before activity) * Albuterol use two time or less a week on average (not counting use with activity) * Cough interfering with sleep two time or less a month * Oral steroids no more than once a year * No hospitalizations  3. Seasonal allergic rhinitis - Continue with Singulair 10mg  daily.  4. Return in about 4 weeks (around 12/21/2016).  Please inform us of any Emergency Department visits, hospitalizations, or changes in symptoms. Call us before going to the ED for breathing or allergy symptoms since we might be able to fit you in for a sick visit. Feel free to contact us anytime with any questions, problems, or concerns.  It was a pleasure to see you again today! Happy spring!   Websites that have reliable patient information: 1. American Academy of Asthma, Allergy, and Immunology: www.aaaai.org 2. Food Allergy Research and Education (FARE): foodallergy.org 3. Mothers of Asthmatics: http://www.asthmacommunitynetwork.org 4. American College of Allergy, Asthma, and Immunology: www.acaai.org

## 2016-11-30 ENCOUNTER — Ambulatory Visit
Admission: RE | Admit: 2016-11-30 | Discharge: 2016-11-30 | Disposition: A | Discharge status: Home / Self Care | Payer: Medicare HMO | Source: Ambulatory Visit | Attending: Internal Medicine | Admitting: Internal Medicine

## 2016-11-30 DIAGNOSIS — Z1231 Encounter for screening mammogram for malignant neoplasm of breast: Secondary | ICD-10-CM

## 2016-12-15 ENCOUNTER — Encounter (HOSPITAL_COMMUNITY): Payer: Self-pay | Admitting: Emergency Medicine

## 2016-12-15 DIAGNOSIS — Z79899 Other long term (current) drug therapy: Secondary | ICD-10-CM | POA: Diagnosis not present

## 2016-12-15 DIAGNOSIS — Z859 Personal history of malignant neoplasm, unspecified: Secondary | ICD-10-CM | POA: Diagnosis not present

## 2016-12-15 DIAGNOSIS — Z87891 Personal history of nicotine dependence: Secondary | ICD-10-CM | POA: Insufficient documentation

## 2016-12-15 DIAGNOSIS — E876 Hypokalemia: Secondary | ICD-10-CM | POA: Insufficient documentation

## 2016-12-15 DIAGNOSIS — M25511 Pain in right shoulder: Secondary | ICD-10-CM | POA: Insufficient documentation

## 2016-12-15 DIAGNOSIS — I10 Essential (primary) hypertension: Secondary | ICD-10-CM | POA: Diagnosis not present

## 2016-12-15 DIAGNOSIS — R200 Anesthesia of skin: Secondary | ICD-10-CM | POA: Diagnosis present

## 2016-12-15 LAB — URINALYSIS, ROUTINE W REFLEX MICROSCOPIC
Bacteria, UA: NONE SEEN
Bilirubin Urine: NEGATIVE
Glucose, UA: NEGATIVE mg/dL
Ketones, ur: NEGATIVE mg/dL
Leukocytes, UA: NEGATIVE
Nitrite: NEGATIVE
Protein, ur: NEGATIVE mg/dL
Specific Gravity, Urine: 1.021 (ref 1.005–1.030)
Squamous Epithelial / LPF: NONE SEEN
pH: 6 (ref 5.0–8.0)

## 2016-12-15 LAB — COMPREHENSIVE METABOLIC PANEL
ALT: 16 U/L (ref 14–54)
AST: 22 U/L (ref 15–41)
Albumin: 3.9 g/dL (ref 3.5–5.0)
Alkaline Phosphatase: 79 U/L (ref 38–126)
Anion gap: 9 (ref 5–15)
BUN: 10 mg/dL (ref 6–20)
CO2: 30 mmol/L (ref 22–32)
Calcium: 9.5 mg/dL (ref 8.9–10.3)
Chloride: 101 mmol/L (ref 101–111)
Creatinine, Ser: 0.86 mg/dL (ref 0.44–1.00)
GFR calc Af Amer: >60 mL/min (ref >60)
GFR calc non Af Amer: >60 mL/min (ref >60)
Glucose, Bld: 108 mg/dL — ABNORMAL HIGH (ref 65–99)
Potassium: 2.9 mmol/L — ABNORMAL LOW (ref 3.5–5.1)
Sodium: 140 mmol/L (ref 135–145)
Total Bilirubin: 0.5 mg/dL (ref 0.3–1.2)
Total Protein: 6.9 g/dL (ref 6.5–8.1)

## 2016-12-15 LAB — CBC
HCT: 34.2 % — ABNORMAL LOW (ref 36.0–46.0)
Hemoglobin: 11.1 g/dL — ABNORMAL LOW (ref 12.0–15.0)
MCH: 27.5 pg (ref 26.0–34.0)
MCHC: 32.5 g/dL (ref 30.0–36.0)
MCV: 84.7 fL (ref 78.0–100.0)
Platelets: 216 10*3/uL (ref 150–400)
RBC: 4.04 MIL/uL (ref 3.87–5.11)
RDW: 13.6 % (ref 11.5–15.5)
WBC: 7.2 10*3/uL (ref 4.0–10.5)

## 2016-12-15 LAB — LIPASE, BLOOD: Lipase: 17 U/L (ref 11–51)

## 2016-12-15 NOTE — ED Triage Notes (Signed)
Patient with right arm numbness and abdominal pain.  She denies any nausea or vomiting with abdominal pain.  She has been having tingling with the numbness in the right arm.  No diarrhea.

## 2016-12-16 ENCOUNTER — Emergency Department (HOSPITAL_COMMUNITY)
Admission: EM | Admit: 2016-12-16 | Discharge: 2016-12-16 | Disposition: A | Discharge status: Home / Self Care | Payer: Medicare HMO | Attending: Emergency Medicine | Admitting: Emergency Medicine

## 2016-12-16 DIAGNOSIS — M25511 Pain in right shoulder: Secondary | ICD-10-CM

## 2016-12-16 DIAGNOSIS — R202 Paresthesia of skin: Secondary | ICD-10-CM

## 2016-12-16 DIAGNOSIS — E876 Hypokalemia: Secondary | ICD-10-CM

## 2016-12-16 MED ORDER — POTASSIUM CHLORIDE CRYS ER 20 MEQ PO TBCR: 20.0000 meq | EXTENDED_RELEASE_TABLET | Freq: Two times a day (BID) | ORAL | 0 refills | Status: DC

## 2016-12-16 NOTE — ED Provider Notes (Signed)
[facility] DEPT Provider Note   CSN: [id number] Arrival date & time: 12/15/16  2111     History   Chief Complaint Chief Complaint  Patient presents with  . Numbness  . Abdominal Pain    HPI Carly Simmons is a 51 y.o. female.  HPI   51 year old female presents today with numerous complaints.  Patient reports that approximately 3 PM today she developed tingling, pain and numbness in her right arm.  Patient notes this is been on and off the recently, but today was more severe.  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  She notes today the symptoms persisted with tingling and numbness in the hand and forearm with pain at the right anterior proximal shoulder.  She notes symptoms were improved with holding her arm above her head.  She denies any other neurological deficits.  She denies any headache, chest pain shortness of breath or any distal neurological complaints.  Patient also notes that she had one episode of a muscle cramp in her abdomen that resolved without intervention.  She denies any abdominal pain presently.  Past Medical History:  Diagnosis Date  . Cancer ([place])   . Hypertension     There are no active problems to display for this patient.   Past Surgical History:  Procedure Laterality Date  . ABDOMINAL HYSTERECTOMY      OB History    No data available       Home Medications    Prior to Admission medications   Medication Sig Start Date End Date Taking? Authorizing Provider  albuterol (PROAIR HFA) 108 (90 BASE) MCG/ACT inhaler USE 2 PUFFS EVERY 4 HOURS AS NEEDED FOR COUGH OR WHEEZE.  MAY USE 2 PUFFS 10-20 MINUTES PRIOR TO EXERCISE. 08/23/15  Yes Roselyn Malachy Moan, MD  fluticasone (FLONASE) 50 MCG/ACT nasal spray Place 2 sprays into both nostrils daily. Patient taking differently: Place 2 sprays into both nostrils daily as needed for allergies.  07/20/16  Yes Valentina Shaggy, MD   hydrochlorothiazide (MICROZIDE) 12.5 MG capsule Take 12.5 mg by mouth daily.   Yes Historical Provider, MD  montelukast (SINGULAIR) 10 MG tablet TAKE ONE EACH EVENING TO PREVENT COUGH OR WHEEZE. 07/20/16  Yes Valentina Shaggy, MD  omeprazole (PRILOSEC) 20 MG capsule Take 20 mg by mouth daily.   Yes Historical Provider, MD  PE-Diphenhydramine-DM-GG-APAP [facility] CHILD MS DAY-NIGHT CLD PO) Take 10 mLs by mouth at bedtime as needed (cold). Reported on 12/20/2015   Yes Historical Provider, MD  potassium chloride SA (K-DUR,KLOR-CON) 20 MEQ tablet Take 1 tablet (20 mEq total) by mouth 2 (two) times daily. 12/16/16   Okey Regal, PA-C    Family History Family History  Problem Relation Age of Onset  . Allergic rhinitis Daughter   . Angioedema Neg Hx   . Asthma Neg Hx   . Eczema Neg Hx   . Immunodeficiency Neg Hx   . Urticaria Neg Hx     Social History Social History  Substance Use Topics  . Smoking status: Former Smoker    Packs/day: 2.00    Types: Cigarettes    Quit date: 09/14/1988  . Smokeless tobacco: Former Systems developer  . Alcohol use No     Allergies   Shellfish allergy; Codeine; Iohexol; and Penicillins   Review of Systems Review of Systems  All other systems reviewed and are negative.   Physical Exam Updated Vital Signs BP 120/84  Pulse (!) 52   Temp 98.1 F (36.7 C) (Oral)   Resp 18   LMP 06/21/2012   SpO2 100%   Physical Exam  Constitutional: She is oriented to person, place, and time. She appears well-developed and well-nourished.  HENT:  Head: Normocephalic and atraumatic.  Eyes: Conjunctivae are normal. Pupils are equal, round, and reactive to light. Right eye exhibits no discharge. Left eye exhibits no discharge. No scleral icterus.  Neck: Normal range of motion. No JVD present. No tracheal deviation present.  Pulmonary/Chest: Effort normal. No stridor.  Musculoskeletal:  TTP of right anterior shoulder  Neurological: She is alert and oriented to person,  place, and time. Coordination normal.  Psychiatric: She has a normal mood and affect. Her behavior is normal. Judgment and thought content normal.  Nursing note and vitals reviewed.    ED Treatments / Results  Labs (all labs ordered are listed, but only abnormal results are displayed) Labs Reviewed  COMPREHENSIVE METABOLIC PANEL - Abnormal; Notable for the following:       Result Value   Potassium 2.9 (*)    Glucose, Bld 108 (*)    All other components within normal limits  CBC - Abnormal; Notable for the following:    Hemoglobin 11.1 (*)    HCT 34.2 (*)    All other components within normal limits  URINALYSIS, ROUTINE W REFLEX MICROSCOPIC - Abnormal; Notable for the following:    Hgb urine dipstick MODERATE (*)    All other components within normal limits  LIPASE, BLOOD    EKG  EKG Interpretation None       Radiology No results found.  Procedures Procedures (including critical care time)  Medications Ordered in ED Medications - No data to display   Initial Impression / Assessment and Plan / ED Course  I have reviewed the triage vital signs and the nursing notes.  Pertinent labs & imaging results that were available during my care of the patient were reviewed by me and considered in my medical decision making (see chart for details).     Labs: Lipase, CMP, CBC  Imaging:  Consults:  Therapeutics:  Discharge Meds:   Assessment/Plan: 51 year old female presents today with numerous complaints.  Patient has been having intermittent pain in paresthesias in her right upper extremity.  Patient is right-handed, she claims for living.  This makes her symptoms worse, they are improved by raising her arm overhead.  She has no signs of significant impingement at the time of evaluation she has no neurological deficits only tenderness to palpation of the right anterior shoulder.  She has no signs of infectious etiology.  She has no other neurological deficits that would  make me concerned for a central cause or significant impingement.  Patient also noted an episode of abdominal pain this was very brief and sounded like a muscle spasm of her abdomen.  Again she is asymptomatic from this, no significant findings that would necessitate further evaluation or management here in the ED.  Patient is instructed to avoid any heavy lifting or strenuous activity with the right upper extremity.  She is instructed to use Tylenol or ibuprofen as needed for discomfort and follow up with her primary care provider this week for reassessment and further evaluation.  She verbalized understanding and agreement to today's plan had no further questions or concerns   Final Clinical Impressions(s) / ED Diagnoses   Final diagnoses:  Paresthesia  Acute pain of right shoulder  Hypokalemia    New Prescriptions  Discharge Medication List as of 12/16/2016  5:59 AM    START taking these medications   Details  potassium chloride SA (K-DUR,KLOR-CON) 20 MEQ tablet Take 1 tablet (20 mEq total) by mouth 2 (two) times daily., Starting Wed 12/16/2016, Print         [employer], PA-C 12/16/16 7209    Ripley Fraise, MD 12/16/16 [phone]

## 2016-12-16 NOTE — Discharge Instructions (Signed)
Please read attached information. If you experience any new or worsening signs or symptoms please return to the emergency room for evaluation. Please follow-up with your primary care provider or specialist as discussed.  °

## 2016-12-21 ENCOUNTER — Ambulatory Visit: Payer: Medicare HMO | Admitting: Allergy & Immunology

## 2017-02-02 ENCOUNTER — Other Ambulatory Visit: Payer: Self-pay | Admitting: Internal Medicine

## 2017-02-02 DIAGNOSIS — E2839 Other primary ovarian failure: Secondary | ICD-10-CM

## 2017-02-11 ENCOUNTER — Ambulatory Visit
Admission: RE | Admit: 2017-02-11 | Discharge: 2017-02-11 | Disposition: A | Discharge status: Home / Self Care | Payer: Medicare HMO | Source: Ambulatory Visit | Attending: Internal Medicine | Admitting: Internal Medicine

## 2017-02-11 DIAGNOSIS — E2839 Other primary ovarian failure: Secondary | ICD-10-CM

## 2017-02-12 ENCOUNTER — Other Ambulatory Visit: Payer: Self-pay | Admitting: Allergy & Immunology

## 2017-02-12 MED ORDER — BUDESONIDE-FORMOTEROL FUMARATE 160-4.5 MCG/ACT IN AERO: 2.0000 | INHALATION_SPRAY | Freq: Two times a day (BID) | RESPIRATORY_TRACT | 1 refills | Status: DC

## 2017-02-12 NOTE — Telephone Encounter (Signed)
Patient would like a refill on her Symbicort. Last seen 11-23-16. [facility].

## 2017-02-12 NOTE — Telephone Encounter (Signed)
Sent in refill for Symbicort 160

## 2017-03-02 ENCOUNTER — Other Ambulatory Visit: Payer: Self-pay | Admitting: Allergy & Immunology

## 2017-03-22 ENCOUNTER — Other Ambulatory Visit: Payer: Self-pay | Admitting: *Deleted

## 2017-03-22 MED ORDER — BUDESONIDE-FORMOTEROL FUMARATE 160-4.5 MCG/ACT IN AERO: 2.0000 | INHALATION_SPRAY | Freq: Two times a day (BID) | RESPIRATORY_TRACT | 1 refills | Status: DC

## 2017-03-24 ENCOUNTER — Other Ambulatory Visit: Payer: Self-pay

## 2017-03-24 MED ORDER — BUDESONIDE-FORMOTEROL FUMARATE 160-4.5 MCG/ACT IN AERO: 2.0000 | INHALATION_SPRAY | Freq: Two times a day (BID) | RESPIRATORY_TRACT | 0 refills | Status: DC

## 2017-04-15 ENCOUNTER — Other Ambulatory Visit: Payer: Self-pay | Admitting: Allergy & Immunology

## 2017-05-08 ENCOUNTER — Other Ambulatory Visit: Payer: Self-pay | Admitting: Allergy & Immunology

## 2017-07-29 ENCOUNTER — Telehealth: Payer: Self-pay | Admitting: *Deleted

## 2017-07-29 ENCOUNTER — Encounter (HOSPITAL_COMMUNITY): Payer: Self-pay | Admitting: Family Medicine

## 2017-07-29 ENCOUNTER — Ambulatory Visit (HOSPITAL_COMMUNITY)
Admission: EM | Admit: 2017-07-29 | Discharge: 2017-07-29 | Disposition: A | Discharge status: Home / Self Care | Payer: 59 | Attending: Emergency Medicine | Admitting: Emergency Medicine

## 2017-07-29 DIAGNOSIS — J302 Other seasonal allergic rhinitis: Secondary | ICD-10-CM | POA: Diagnosis not present

## 2017-07-29 DIAGNOSIS — R0981 Nasal congestion: Secondary | ICD-10-CM | POA: Diagnosis present

## 2017-07-29 MED ORDER — OXYMETAZOLINE HCL 0.05 % NA SOLN: 1.0000 | Freq: Two times a day (BID) | NASAL | 0 refills | Status: AC

## 2017-07-29 MED ORDER — FLUTICASONE PROPIONATE 50 MCG/ACT NA SUSP: 2.0000 | Freq: Every day | NASAL | 5 refills | Status: AC

## 2017-07-29 NOTE — Discharge Instructions (Addendum)
Take meds as directed. Follow up with PCP allergist for further management of nasal congestion

## 2017-07-29 NOTE — ED Triage Notes (Signed)
Pt here for sinus congestion x 3 days and she has been using mucinex but not better. Pedialyte for dehydration and veggies to boost immune system. Reports drainage and itching in throat. No coughing.

## 2017-07-29 NOTE — Telephone Encounter (Signed)
Patient called states she thinks she has a sinus infection.  Patient states she has body aches, headaches, chills at time. States she is taking mucinex, allergy medicine and drinking plenty of fluids. advised patient to go see pcp or urgent to get checked for flu patient verbalized understanding.

## 2017-07-29 NOTE — ED Provider Notes (Signed)
[facility]    CSN: [id number] Arrival date & time: 07/29/17  1554     History   Chief Complaint Chief Complaint  Patient presents with  . Nasal Congestion    HPI Carly Simmons is a 51 y.o. female.   51 yr old AA female presents to UC with cc of nasal congestion x 3 days, states she ahs used Mucinex without relief.    The history is provided by the patient. No language interpreter was used.  URI  Presenting symptoms: congestion   Severity:  Mild Onset quality:  Sudden Duration:  3 days Timing:  Constant   Past Medical History:  Diagnosis Date  . Cancer ([place])   . Hypertension     Patient Active Problem List   Diagnosis Date Noted  . Nasal congestion 07/29/2017  . Seasonal allergies 07/29/2017    Past Surgical History:  Procedure Laterality Date  . ABDOMINAL HYSTERECTOMY      OB History    No data available       Home Medications    Prior to Admission medications   Medication Sig Start Date End Date Taking? Authorizing Provider  albuterol (PROAIR HFA) 108 (90 BASE) MCG/ACT inhaler USE 2 PUFFS EVERY 4 HOURS AS NEEDED FOR COUGH OR WHEEZE.  MAY USE 2 PUFFS 10-20 MINUTES PRIOR TO EXERCISE. 08/23/15   Gean Quint, MD  budesonide-formoterol [facility]) 160-4.5 MCG/ACT inhaler Inhale 2 puffs into the lungs 2 (two) times daily. 03/24/17   Valentina Shaggy, MD  fluticasone [facility]) 50 MCG/ACT nasal spray Place 2 sprays daily into both nostrils. 56/31/49   Nelle Sayed, Jeanett Schlein, NP  hydrochlorothiazide (MICROZIDE) 12.5 MG capsule Take 12.5 mg by mouth daily.    [provider]  montelukast (SINGULAIR) 10 MG tablet TAKE 1 TABLET EACH EVENING TO PREVENT COUGH OR WHEEZE 05/10/17   Valentina Shaggy, MD  omeprazole (PRILOSEC) 20 MG capsule Take 20 mg by mouth daily.    [provider]  oxymetazoline (AFRIN NASAL SPRAY) 0.05 % nasal spray Place 1 spray 2 (two) times daily for 3 days into both nostrils. 70/26/37 85/88/50  Parisha Beaulac,  Jeanett Schlein, NP  PE-Diphenhydramine-DM-GG-APAP [facility] CHILD MS DAY-NIGHT CLD PO) Take 10 mLs by mouth at bedtime as needed (cold). Reported on 12/20/2015    [provider]  potassium chloride SA (K-DUR,KLOR-CON) 20 MEQ tablet Take 1 tablet (20 mEq total) by mouth 2 (two) times daily. 12/16/16   Okey Regal, PA-C  SYMBICORT 160-4.5 MCG/ACT inhaler TAKE 2 PUFFS TWICE A DAY 04/15/17   Valentina Shaggy, MD    Family History Family History  Problem Relation Age of Onset  . Allergic rhinitis Daughter   . Angioedema Neg Hx   . Asthma Neg Hx   . Eczema Neg Hx   . Immunodeficiency Neg Hx   . Urticaria Neg Hx     Social History Social History   Tobacco Use  . Smoking status: Former Smoker    Packs/day: 2.00    Types: Cigarettes    Last attempt to quit: 09/14/1988    Years since quitting: 28.8  . Smokeless tobacco: Former Network engineer Use Topics  . Alcohol use: No    Alcohol/week: 0.0 oz  . Drug use: No     Allergies   Shellfish allergy; Codeine; Iohexol; and Penicillins   Review of Systems Review of Systems  HENT: Positive for congestion and sinus pressure.   All other systems reviewed and are negative.    Physical Exam Triage  Vital Signs ED Triage Vitals  Enc Vitals Group     BP 07/29/17 1619 125/78     Pulse Rate 07/29/17 1619 (!) 52     Resp 07/29/17 1619 18     Temp 07/29/17 1619 98.5 F (36.9 C)     Temp src --      SpO2 07/29/17 1619 100 %     Weight --      Height --      Head Circumference --      Peak Flow --      Pain Score 07/29/17 1617 4     Pain Loc --      Pain Edu? --      Excl. in [place]? --    No data found.  Updated Vital Signs BP 125/78   Pulse (!) 52   Temp 98.5 F (36.9 C)   Resp 18   LMP 06/21/2012   SpO2 100%   Visual Acuity Right Eye Distance:   Left Eye Distance:   Bilateral Distance:    Right Eye Near:   Left Eye Near:    Bilateral Near:     Physical Exam  Constitutional: She is oriented to person, place,  and time. She appears well-developed and well-nourished. She is active and cooperative. No distress.  HENT:  Head: Normocephalic.  Right Ear: Tympanic membrane is retracted.  Left Ear: Tympanic membrane is retracted.  Nose: Mucosal edema present. Right sinus exhibits no maxillary sinus tenderness. Left sinus exhibits no maxillary sinus tenderness.  Mouth/Throat: Uvula is midline, oropharynx is clear and moist and mucous membranes are normal.  Eyes: Conjunctivae, EOM and lids are normal. Pupils are equal, round, and reactive to light.  Neck: Trachea normal and normal range of motion. No tracheal deviation present.  Cardiovascular: Regular rhythm, normal heart sounds and normal pulses. Bradycardia present.  No murmur heard. Pulmonary/Chest: Effort normal and breath sounds normal.  Abdominal: Soft. Bowel sounds are normal. There is no tenderness.  Musculoskeletal: Normal range of motion.  Lymphadenopathy:    She has no cervical adenopathy.  Neurological: She is alert and oriented to person, place, and time. GCS eye subscore is 4. GCS verbal subscore is 5. GCS motor subscore is 6.  Skin: Skin is warm and dry. No rash noted.  Psychiatric: She has a normal mood and affect. Her speech is normal and behavior is normal.  Nursing note and vitals reviewed.    UC Treatments / Results  Labs (all labs ordered are listed, but only abnormal results are displayed) Labs Reviewed - No data to display  EKG  EKG Interpretation None       Radiology No results found.  Procedures Procedures (including critical care time)  Medications Ordered in UC Medications - No data to display   Initial Impression / Assessment and Plan / UC Course  I have reviewed the triage vital signs and the nursing notes.  Pertinent labs & imaging results that were available during my care of the patient were reviewed by me and considered in my medical decision making (see chart for details).    Take meds as  directed. Follow up with PCP allergist for further management of nasal congestion    Final Clinical Impressions(s) / UC Diagnoses   Final diagnoses:  Nasal congestion  Seasonal allergies    ED Discharge Orders        Ordered    fluticasone (FLONASE) 50 MCG/ACT nasal spray  Daily     07/29/17 1703  oxymetazoline (AFRIN NASAL SPRAY) 0.05 % nasal spray  2 times daily     07/29/17 1703       Controlled Substance Prescriptions    Tony Friscia, Jeanett Schlein, NP 83/09/40 2122

## 2017-08-21 ENCOUNTER — Other Ambulatory Visit: Payer: Self-pay | Admitting: Allergy & Immunology

## 2017-09-28 ENCOUNTER — Encounter: Payer: Self-pay | Admitting: Physician Assistant

## 2017-09-28 ENCOUNTER — Other Ambulatory Visit: Payer: Self-pay

## 2017-09-28 ENCOUNTER — Ambulatory Visit (INDEPENDENT_AMBULATORY_CARE_PROVIDER_SITE_OTHER): Payer: 59 | Admitting: Physician Assistant

## 2017-09-28 ENCOUNTER — Other Ambulatory Visit: Payer: Self-pay | Admitting: Allergy & Immunology

## 2017-09-28 VITALS — BP 126/78 | HR 80 | Temp 98.4°F | Resp 16 | Ht 69.0 in | Wt 229.2 lb

## 2017-09-28 DIAGNOSIS — R200 Anesthesia of skin: Secondary | ICD-10-CM

## 2017-09-28 DIAGNOSIS — R202 Paresthesia of skin: Secondary | ICD-10-CM | POA: Diagnosis not present

## 2017-09-28 DIAGNOSIS — Z23 Encounter for immunization: Secondary | ICD-10-CM | POA: Diagnosis not present

## 2017-09-28 DIAGNOSIS — L600 Ingrowing nail: Secondary | ICD-10-CM | POA: Diagnosis not present

## 2017-09-28 LAB — POCT CBC
Granulocyte percent: 59.2 %G (ref 37–80)
HCT, POC: 37.7 % (ref 37.7–47.9)
Hemoglobin: 12.5 g/dL (ref 12.2–16.2)
Lymph, poc: 2.7 (ref 0.6–3.4)
MCH, POC: 28.2 pg (ref 27–31.2)
MCHC: 33.2 g/dL (ref 31.8–35.4)
MCV: 85.1 fL (ref 80–97)
MID (cbc): 0.4 (ref 0–0.9)
MPV: 8.9 fL (ref 0–99.8)
POC Granulocyte: 4.4 (ref 2–6.9)
POC LYMPH PERCENT: 35.9 %L (ref 10–50)
POC MID %: 4.9 %M (ref 0–12)
Platelet Count, POC: 220 10*3/uL (ref 142–424)
RBC: 4.43 M/uL (ref 4.04–5.48)
RDW, POC: 14 %
WBC: 7.5 10*3/uL (ref 4.6–10.2)

## 2017-09-28 LAB — POCT GLYCOSYLATED HEMOGLOBIN (HGB A1C): Hemoglobin A1C: 5.5

## 2017-09-28 NOTE — Progress Notes (Signed)
Carly Simmons  MRN: [id number] DOB: April 10, 1966  PCP: Carly Ebbs, MD  Subjective:  Pt is a 52 year old female who  presents to clinic for toe pain x 1 week.  Pain is of her great toe of left foot. Feels like she has an ingrown toenail. Endorses numbness and tingling of all her toes. 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 She has not done anything to feel better.  No h/o DM. Has not had her blood sugar checked in about one year.  ROS below.   She would like flu shot today  Review of Systems  Constitutional: Negative for chills, diaphoresis, fatigue and fever.  Endocrine: Negative for polydipsia, polyphagia and polyuria.  Musculoskeletal: Positive for arthralgias and gait problem.  Neurological: Positive for numbness. Negative for dizziness, weakness and light-headedness.    Patient Active Problem List   Diagnosis Date Noted  . Nasal congestion 07/29/2017  . Seasonal allergies 07/29/2017    Current Outpatient Medications on File Prior to Visit  Medication Sig Dispense Refill  . hydrochlorothiazide (MICROZIDE) 12.5 MG capsule Take 12.5 mg by mouth daily.    . montelukast (SINGULAIR) 10 MG tablet TAKE 1 TABLET EACH EVENING TO PREVENT COUGH OR WHEEZE 30 tablet 0  . omeprazole (PRILOSEC) 20 MG capsule Take 20 mg by mouth daily.    Marland Kitchen albuterol (PROAIR HFA) 108 (90 BASE) MCG/ACT inhaler USE 2 PUFFS EVERY 4 HOURS AS NEEDED FOR COUGH OR WHEEZE.  MAY USE 2 PUFFS 10-20 MINUTES PRIOR TO EXERCISE. (Patient not taking: Reported on 09/28/2017) 1 Inhaler 1  . budesonide-formoterol (SYMBICORT) 160-4.5 MCG/ACT inhaler Inhale 2 puffs into the lungs 2 (two) times daily. (Patient not taking: Reported on 09/28/2017) 30.6 g 0  . fluticasone (FLONASE) 50 MCG/ACT nasal spray Place 2 sprays daily into both nostrils. (Patient not taking: Reported on 09/28/2017) 1 g 5  . PE-Diphenhydramine-DM-GG-APAP (MUCINEX CHILD MS DAY-NIGHT CLD PO) Take 10 mLs by mouth at bedtime as needed (cold). Reported  on 12/20/2015    . potassium chloride SA (K-DUR,KLOR-CON) 20 MEQ tablet Take 1 tablet (20 mEq total) by mouth 2 (two) times daily. (Patient not taking: Reported on 09/28/2017) 15 tablet 0  . SYMBICORT 160-4.5 MCG/ACT inhaler TAKE 2 PUFFS TWICE A DAY (Patient not taking: Reported on 09/28/2017) 10.2 Inhaler 3   No current facility-administered medications on file prior to visit.     Allergies  Allergen Reactions  . Shellfish Allergy Anaphylaxis  . Codeine     [name]  . Iohexol Hives  . Penicillins Rash     Objective:  BP 126/78   Pulse 80   Temp 98.4 F (36.9 C)   Resp 16   Ht 5\' 9"  (1.753 m)   Wt 229 lb 3.2 oz (104 kg)   LMP 06/21/2012   SpO2 97%   BMI 33.85 kg/m   Physical Exam  Constitutional: She is oriented to person, place, and time and well-developed, well-nourished, and in no distress. No distress.  Neurological: She is alert and oriented to person, place, and time. She has normal sensation. GCS score is 15.  Skin: Skin is warm and dry.  Ingrown toe nail medial left great toe. No pustule, erythema, drainage. Area is TTP. Onychomycosis appreciated.   Psychiatric: Mood, memory, affect and judgment normal.  Vitals reviewed.   Results for orders placed or performed in visit on 09/28/17  POCT glycosylated hemoglobin (Hb A1C)  Result Value Ref Range  Hemoglobin A1C 5.5   POCT CBC  Result Value Ref Range   WBC 7.5 4.6 - 10.2 K/uL   Lymph, poc 2.7 0.6 - 3.4   POC LYMPH PERCENT 35.9 10 - 50 %L   MID (cbc) 0.4 0 - 0.9   POC MID % 4.9 0 - 12 %M   POC Granulocyte 4.4 2 - 6.9   Granulocyte percent 59.2 37 - 80 %G   RBC 4.43 4.04 - 5.48 M/uL   Hemoglobin 12.5 12.2 - 16.2 g/dL   HCT, POC 37.7 37.7 - 47.9 %   MCV 85.1 80 - 97 fL   MCH, POC 28.2 27 - 31.2 pg   MCHC 33.2 31.8 - 35.4 g/dL   RDW, POC 14.0 %   Platelet Count, POC 220 142 - 424 K/uL   MPV 8.9 0 - 99.8 fL   Procedure: Verbal consent obtained. Skin was cleaned with alcohol. Digital  block performed with lidocaine on 1st toe of left foot.  Toenail was completely avulsed from nailbed. Xeroform placed on nailbed. Dressing placed and wound care discussed.  Assessment and Plan :  1. Ingrowing nail, left great toe 2. Numbness and tingling of both feet - POCT glycosylated hemoglobin (Hb A1C) - POCT CBC - Uric Acid - CBC and A1C are wnl. Toe nail removed without incident. Wound dressed and wound care discussed. RTC if symptoms worsen. Advised pt of ingrown toe nail avoidance in the future.  3. Need for prophylactic vaccination and inoculation against influenza - Flu Vaccine QUAD 6+ mos PF IM (Fluarix Quad PF)  Mercer Pod, PA-C  [facility] 09/28/2017 5:00 PM

## 2017-09-28 NOTE — Patient Instructions (Addendum)
INGROWN TOENAIL . Keep area clean, dry and bandaged for 24 hours. . After 24 hours, remove outer bandage and leave yellow gauze in place. Carly Simmons toe/foot in warm soapy water for 5-10 minutes, once daily for 5 days. Rebandage toe after each cleaning. . Continue soaks until yellow gauze falls off. . Notify the office if you experience any of the following signs of infection: Swelling, redness, pus drainage, streaking, fever > 101.0 F   Ingrown Toenail An ingrown toenail occurs when the corner or sides of your toenail grow into the surrounding skin. The big toe is most commonly affected, but it can happen to any of your toes. If your ingrown toenail is not treated, you will be at risk for infection. What are the causes? This condition may be caused by:  Wearing shoes that are too small or tight.  Injury or trauma, such as stubbing your toe or having your toe stepped on.  Improper cutting or care of your toenails.  Being born with (congenital) nail or foot abnormalities, such as having a nail that is too big for your toe.  What increases the risk? Risk factors for an ingrown toenail include:  Age. Your nails tend to thicken as you get older, so ingrown nails are more common in older people.  Diabetes.  Cutting your toenails incorrectly.  Blood circulation problems.  What are the signs or symptoms? Symptoms may include:  Pain, soreness, or tenderness.  Redness.  Swelling.  Hardening of the skin surrounding the toe.  Your ingrown toenail may be infected if there is fluid, pus, or drainage. How is this diagnosed? An ingrown toenail may be diagnosed by medical history and physical exam. If your toenail is infected, your health care provider may test a sample of the drainage. How is this treated? Treatment depends on the severity of your ingrown toenail. Some ingrown toenails may be treated at home. More severe or infected ingrown toenails may require surgery to remove  all or part of the nail. Infected ingrown toenails may also be treated with antibiotic medicines. Follow these instructions at home:  If you were prescribed an antibiotic medicine, finish all of it even if you start to feel better.  Soak your foot in warm soapy water for 20 minutes, 3 times per day or as directed by your health care provider.  Carefully lift the edge of the nail away from the sore skin by wedging a small piece of cotton under the corner of the nail. This may help with the pain. Be careful not to cause more injury to the area.  Wear shoes that fit well. If your ingrown toenail is causing you pain, try wearing sandals, if possible.  Trim your toenails regularly and carefully. Do not cut them in a curved shape. Cut your toenails straight across. This prevents injury to the skin at the corners of the toenail.  Keep your feet clean and dry.  If you are having trouble walking and are given crutches by your health care provider, use them as directed.  Do not pick at your toenail or try to remove it yourself.  Take medicines only as directed by your health care provider.  Keep all follow-up visits as directed by your health care provider. This is important. Contact a health care provider if:  Your symptoms do not improve with treatment. Get help right away if:  You have red streaks that start at your foot and go up your leg.  You have a fever.  You have increased redness, swelling, or pain.  You have fluid, blood, or pus coming from your toenail. This information is not intended to replace advice given to you by your health care provider. Make sure you discuss any questions you have with your health care provider. Document Released: 08/28/2000 Document Revised: 01/31/2016 Document Reviewed: 07/25/2014 Elsevier Interactive Patient Education  2018 [employer].    IF you received an x-ray today, you will receive an invoice from [facility]. Please contact  [facility] at [phone] with questions or concerns regarding your invoice.   IF you received labwork today, you will receive an invoice from [facility]. Please contact [facility] at [phone] with questions or concerns regarding your invoice.   Our billing staff will not be able to assist you with questions regarding bills from these companies.  You will be contacted with the lab results as soon as they are available. The fastest way to get your results is to activate your My Chart account. Instructions are located on the last page of this paperwork. If you have not heard from [place] regarding the results in 2 weeks, please contact this office.

## 2017-09-29 ENCOUNTER — Encounter: Payer: Self-pay | Admitting: Physician Assistant

## 2017-09-29 LAB — URIC ACID: Uric Acid: 5.7 mg/dL (ref 2.5–7.1)

## 2017-10-05 ENCOUNTER — Other Ambulatory Visit: Payer: Self-pay

## 2017-10-05 NOTE — Telephone Encounter (Signed)
Received fax for 90 supply for montelukast. Patient was last seen 11/23/2016. Patient was to return in 4 weeks. Patient needs office visit.

## 2017-10-07 ENCOUNTER — Other Ambulatory Visit: Payer: Self-pay | Admitting: Allergy & Immunology

## 2017-10-07 NOTE — Telephone Encounter (Signed)
Courtesy refill  

## 2017-11-17 ENCOUNTER — Other Ambulatory Visit: Payer: Self-pay | Admitting: Internal Medicine

## 2017-11-17 DIAGNOSIS — Z1231 Encounter for screening mammogram for malignant neoplasm of breast: Secondary | ICD-10-CM

## 2017-12-08 ENCOUNTER — Ambulatory Visit: Payer: 59

## 2017-12-29 ENCOUNTER — Other Ambulatory Visit: Payer: Self-pay | Admitting: *Deleted

## 2017-12-30 ENCOUNTER — Encounter: Payer: Self-pay | Admitting: Allergy & Immunology

## 2017-12-30 ENCOUNTER — Ambulatory Visit (INDEPENDENT_AMBULATORY_CARE_PROVIDER_SITE_OTHER): Payer: 59 | Admitting: Allergy & Immunology

## 2017-12-30 ENCOUNTER — Other Ambulatory Visit: Payer: Self-pay

## 2017-12-30 VITALS — BP 130/80 | HR 78 | Resp 18

## 2017-12-30 DIAGNOSIS — T7800XA Anaphylactic reaction due to unspecified food, initial encounter: Secondary | ICD-10-CM | POA: Insufficient documentation

## 2017-12-30 DIAGNOSIS — J4540 Moderate persistent asthma, uncomplicated: Secondary | ICD-10-CM

## 2017-12-30 DIAGNOSIS — J302 Other seasonal allergic rhinitis: Secondary | ICD-10-CM | POA: Diagnosis not present

## 2017-12-30 DIAGNOSIS — T7800XD Anaphylactic reaction due to unspecified food, subsequent encounter: Secondary | ICD-10-CM | POA: Diagnosis not present

## 2017-12-30 MED ORDER — MONTELUKAST SODIUM 10 MG PO TABS: 10.0000 mg | ORAL_TABLET | Freq: Every day | ORAL | 5 refills | Status: DC

## 2017-12-30 MED ORDER — MONTELUKAST SODIUM 10 MG PO TABS: 10.0000 mg | ORAL_TABLET | Freq: Every day | ORAL | 3 refills | Status: AC

## 2017-12-30 MED ORDER — EPINEPHRINE 0.3 MG/0.3ML IJ SOAJ: 0.3000 mg | Freq: Once | INTRAMUSCULAR | 1 refills | Status: AC

## 2017-12-30 NOTE — Patient Instructions (Addendum)
1. Adverse food reaction (shellfish) - Continue to avoid shellfish. - Beware of cross contamination. - EpiPen refilled today.   2. Moderate persistent asthma - on no inhaled medications at this time - I suppose it is OK to stop your inhalers since you are doing fine at this time, but consider restarting if you have worsening coughing/wheezing.  - Since you are taking yourself off of your medications, I would like to see you more frequently (around every 4 months).  - Daily controller medication(s): Singulair 10mg  daily and Symbicort 160/4.24mcg two puffs twice daily with spacer - Prior to physical activity: ProAir 2 puffs 10-15 minutes before physical activity. - Rescue medications: ProAir 4 puffs every 4-6 hours as needed - Asthma control goals:  * Full participation in all desired activities (may need albuterol before activity) * Albuterol use two time or less a week on average (not counting use with activity) * Cough interfering with sleep two time or less a month * Oral steroids no more than once a year * No hospitalizations  3. Seasonal allergic rhinitis - Continue with Singulair 10mg  daily (refills sent in). - Continue with Zyrtec (cetirizine) 10mg  daily as needed.   4. Return in about 4 months (around 05/01/2018).   Please inform us of any Emergency Department visits, hospitalizations, or changes in symptoms. Call us before going to the ED for breathing or allergy symptoms since we might be able to fit you in for a sick visit. Feel free to contact us anytime with any questions, problems, or concerns.  It was a pleasure to see you again today!  Websites that have reliable patient information: 1. American Academy of Asthma, Allergy, and Immunology: www.aaaai.org 2. Food Allergy Research and Education (FARE): foodallergy.org 3. Mothers of Asthmatics: http://www.asthmacommunitynetwork.org 4. American College of Allergy, Asthma, and Immunology: www.acaai.org

## 2017-12-30 NOTE — Progress Notes (Signed)
FOLLOW UP  Date of Service/Encounter:  12/30/17   Assessment:   Moderate persistent asthma without complication  Seasonal allergic rhinitis  Anaphylactic shock due to food (shellfish)  Plan/Recommendations:   Ms. Gafford is doing well at this time without the use of the Symbicort. It seems that she has been able to monitor her psychiatric triggers and modulate the stress in her life, which was previously a strong trigger for her symptoms. I do not think that it is advisable to continue without a controller and I would much rather have her use the Symbicort at least daily, but Ms. Alcalde prefers to be off of all medications. Given this, we will see her on a more frequent basis to make sure that her symptoms are under good control. She is in agreement with this plan.   1. Adverse food reaction (shellfish) - Continue to avoid shellfish. - Beware of cross contamination. - EpiPen refilled today.   2. Moderate persistent asthma - on no inhaled medications at this time - I suppose it is OK to stop your inhalers since you are doing fine at this time, but consider restarting if you have worsening coughing/wheezing.  - Since you are taking yourself off of your medications, I would like to see you more frequently (around every 4 months).  - Daily controller medication(s): Singulair 10mg  daily and Symbicort 160/4.47mcg two puffs twice daily with spacer - Prior to physical activity: ProAir 2 puffs 10-15 minutes before physical activity. - Rescue medications: ProAir 4 puffs every 4-6 hours as needed - Asthma control goals:  * Full participation in all desired activities (may need albuterol before activity) * Albuterol use two time or less a week on average (not counting use with activity) * Cough interfering with sleep two time or less a month * Oral steroids no more than once a year * No hospitalizations  3. Seasonal allergic rhinitis - Continue with Singulair 10mg  daily (refills sent  in). - Continue with Zyrtec (cetirizine) 10mg  daily as needed.   4. Return in about 4 months (around 05/01/2018).  Subjective:   Dangela How is a 52 y.o. female presenting today for follow up of  Chief Complaint  Patient presents with  . Food Intolerance    doing well.   . Pruritis    eyes started to itch when the pollen season began.     Aylssa Herrig has a history of the following: Patient Active Problem List   Diagnosis Date Noted  . Moderate persistent asthma without complication 94/76/5465  . Other seasonal allergic rhinitis 12/30/2017  . 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 Nasal congestion 07/29/2017  . Seasonal allergies 07/29/2017    History obtained from: chart review and patient.  Levada Dy Termine's Primary Care Provider is Nolene Ebbs, MD.     Miryah is a 52 y.o. female presenting for a follow up visit. She was last seen in March 2018 at which time she failed her shrimp challenge. Lung testing looked slightly worse and she improved with the nebulizer treatment. We started her on Symbicort 160/4.56 two puffs twice daily. She also has a history of allergic rhinitis and we continued her on Singulair 10mg  daily.   Since the last visit, she has done well. She needs refills which is what brings her in today.    Asthma/Respiratory Symptom History: She reports that her symptoms are not "continuous". She thinks that her symptoms are triggered by stress. She tells me that she is "detox[ing] her  mind". She is out of the Symbicort and has not needed it for several months. She does not want to take any of her inhalers.   Allergic Rhinitis Symptom History: She is having problems with her eyes recently. She is out of her montelukast and has not taken it in quite some time. She does not have eye drops at all (artificial tears only). 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 It all depends on when her house is full of pollen and  whatnot.   Food Allergy Symptom History: She no longer eats any shellfish. She does not want to risk cross contamination. She is careful about talking to people at restaurants who are preparing her meals. She also avoids [place] seasoning.  Otherwise, there have been no changes to her past medical history, surgical history, family history, or social history. 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     Review of Systems: a 14-point review of systems is pertinent for what is mentioned in HPI.  Otherwise, all other systems were negative. Constitutional: negative other than that listed in the HPI Eyes: negative other than that listed in the HPI Ears, nose, mouth, throat, and face: negative other than that listed in the HPI Respiratory: negative other than that listed in the HPI Cardiovascular: negative other than that listed in the HPI Gastrointestinal: negative other than that listed in the HPI Genitourinary: negative other than that listed in the HPI Integument: negative other than that listed in the HPI Hematologic: negative other than that listed in the HPI Musculoskeletal: negative other than that listed in the HPI Neurological: negative other than that listed in the HPI Allergy/Immunologic: negative other than that listed in the HPI    Objective:   Blood pressure 130/80, pulse 78, resp. rate 18, last menstrual period 06/21/2012, SpO2 97 %. There is no height or weight on file to calculate BMI.   Physical Exam:  General: Alert, interactive, in no acute distress. Pleasant female and smiling.  Eyes: No conjunctival injection bilaterally, no discharge on the right, no discharge on the left and no Horner-Trantas dots present. PERRL bilaterally. EOMI without pain. No photophobia.  Ears: Right TM pearly gray with normal light reflex, Left TM pearly gray with normal light reflex, Right TM intact without perforation and Left TM intact  without perforation.  Nose/Throat: External nose within normal limits and septum midline. Turbinates edematous and pale with clear discharge. Posterior oropharynx erythematous without cobblestoning in the posterior oropharynx. Tonsils 2+ without exudates.  Tongue without thrush. Lungs: Decreased breath sounds bilaterally without wheezing, rhonchi or rales. No increased work of breathing. CV: Normal S1/S2. No murmurs. Capillary refill <2 seconds.  Skin: Warm and dry, without lesions or rashes. Neuro:   Grossly intact. No focal deficits appreciated. Responsive to questions.  Diagnostic studies:   Spirometry: results normal (FEV1: 1.98/75%, FVC: 2.33/70%, FEV1/FVC: 85%).    Spirometry consistent with normal pattern.  Allergy Studies: none      Salvatore Marvel, MD  [facility]

## 2018-01-09 ENCOUNTER — Encounter (HOSPITAL_COMMUNITY): Payer: Self-pay | Admitting: Family Medicine

## 2018-01-09 ENCOUNTER — Ambulatory Visit (HOSPITAL_COMMUNITY)
Admission: EM | Admit: 2018-01-09 | Discharge: 2018-01-09 | Disposition: A | Discharge status: Home / Self Care | Payer: 59 | Attending: Internal Medicine | Admitting: Internal Medicine

## 2018-01-09 DIAGNOSIS — M25572 Pain in left ankle and joints of left foot: Secondary | ICD-10-CM

## 2018-01-09 MED ORDER — MELOXICAM 7.5 MG PO TABS: 7.5000 mg | ORAL_TABLET | Freq: Every day | ORAL | 0 refills | Status: DC

## 2018-01-09 MED ORDER — PREDNISONE 20 MG PO TABS: 40.0000 mg | ORAL_TABLET | Freq: Every day | ORAL | 0 refills | Status: AC

## 2018-01-09 NOTE — Discharge Instructions (Signed)
I am treating you for Achilles tendinitis.  Start Mobic, prednisone as directed.  Ice compress, elevation, stretches.  Ankle brace during activity.  Follow-up with PCP/podiatry for further evaluation if symptoms not improving.

## 2018-01-09 NOTE — ED Provider Notes (Signed)
Carly Simmons    CSN: [id number] Arrival date & time: 01/09/18  1517     History   Chief Complaint Chief Complaint  Patient presents with  . Foot Pain    HPI Carly Simmons is a 52 y.o. female.   52 year old female comes in for 1 month history of left ankle pain.  Denies injury/trauma.  Work requires lots of walking and activity.  States some swelling to the posterior of the left ankle that is tender to palpation.  Denies pain at rest.  Pain mostly triggered from flexion and extension of the ankle and weightbearing.  States given she is without a vehicle, walks to take the bus, and the walking is mostly what exacerbates the pain.  Taking ibuprofen with little relief.  Currently wearing a brace with little relief.     Past Medical History:  Diagnosis Date  . Cancer ([place])   . Hypertension     Patient Active Problem List   Diagnosis Date Noted  . Moderate persistent asthma without complication 78/29/5621  . Other seasonal allergic rhinitis 12/30/2017  . 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 Nasal congestion 07/29/2017  . Seasonal allergies 07/29/2017    Past Surgical History:  Procedure Laterality Date  . ABDOMINAL HYSTERECTOMY      OB History   None      Home Medications    Prior to Admission medications   Medication Sig Start Date End Date Taking? Authorizing Provider  albuterol (PROAIR HFA) 108 (90 BASE) MCG/ACT inhaler USE 2 PUFFS EVERY 4 HOURS AS NEEDED FOR COUGH OR WHEEZE.  MAY USE 2 PUFFS 10-20 MINUTES PRIOR TO EXERCISE. 08/23/15   Gean Quint, MD  budesonide-formoterol [facility]) 160-4.5 MCG/ACT inhaler Inhale 2 puffs into the lungs 2 (two) times daily. 03/24/17   Valentina Shaggy, MD  EPINEPHrine 0.3 mg/0.3 mL IJ SOAJ injection Inject 0.3 mLs (0.3 mg total) into the muscle once for 1 dose. 12/30/17 12/30/17  Valentina Shaggy, MD  fluticasone [facility]) 50 MCG/ACT nasal spray Place 2 sprays daily into both  nostrils. 30/86/57   Defelice, Jeanett Schlein, NP  hydrochlorothiazide (MICROZIDE) 12.5 MG capsule Take 12.5 mg by mouth daily.    [provider]  meloxicam (MOBIC) 7.5 MG tablet Take 1 tablet (7.5 mg total) by mouth daily. 01/09/18   Tasia Catchings, Amy V, PA-C  montelukast (SINGULAIR) 10 MG tablet Take 1 tablet (10 mg total) by mouth at bedtime. 12/30/17   Valentina Shaggy, MD  predniSONE (DELTASONE) 20 MG tablet Take 2 tablets (40 mg total) by mouth daily for 5 days. 01/09/18 01/14/18  Ok Edwards, PA-C  simvastatin (ZOCOR) 20 MG tablet  12/24/17   [provider]    Family History Family History  Problem Relation Age of Onset  . Allergic rhinitis Daughter   . Angioedema Neg Hx   . Asthma Neg Hx   . Eczema Neg Hx   . Immunodeficiency Neg Hx   . Urticaria Neg Hx     Social History Social History   Tobacco Use  . Smoking status: Former Smoker    Packs/day: 2.00    Types: Cigarettes    Last attempt to quit: 09/14/1988    Years since quitting: 29.3  . Smokeless tobacco: Former Network engineer Use Topics  . Alcohol use: No    Alcohol/week: 0.0 oz  . Drug use: No     Allergies   Shellfish allergy; Codeine; Iohexol; and Penicillins   Review of  Systems Review of Systems  Reason unable to perform ROS: See HPI as above.     Physical Exam Triage Vital Signs ED Triage Vitals  Enc Vitals Group     BP 01/09/18 1619 (!) 131/91     Pulse Rate 01/09/18 1619 69     Resp 01/09/18 1619 18     Temp --      Temp src --      SpO2 01/09/18 1619 100 %     Weight --      Height --      Head Circumference --      Peak Flow --      Pain Score 01/09/18 1618 4     Pain Loc --      Pain Edu? --      Excl. in [place]? --    No data found.  Updated Vital Signs BP (!) 131/91   Pulse 69   Resp 18   LMP 06/21/2012   SpO2 100%   Visual Acuity Right Eye Distance:   Left Eye Distance:   Bilateral Distance:    Right Eye Near:   Left Eye Near:    Bilateral Near:     Physical Exam    Constitutional: She is oriented to person, place, and time. She appears well-developed and well-nourished. No distress.  HENT:  Head: Normocephalic and atraumatic.  Eyes: Pupils are equal, round, and reactive to light. Conjunctivae are normal.  Musculoskeletal:  Swelling at the posterior left heel around the bursa. No fluctuance felt. No erythema, increased warmth. Area tender to palpation. Full ROM of ankle. Strength normal and equal bilaterally. Sensation intact and equal bilaterally. Pedal pulse 2+ and equal bilaterally.   Neurological: She is alert and oriented to person, place, and time.     UC Treatments / Results  Labs (all labs ordered are listed, but only abnormal results are displayed) Labs Reviewed - No data to display  EKG None Radiology No results found.  Procedures Procedures (including critical care time)  Medications Ordered in UC Medications - No data to display   Initial Impression / Assessment and Plan / UC Course  I have reviewed the triage vital signs and the nursing notes.  Pertinent labs & imaging results that were available during my care of the patient were reviewed by me and considered in my medical decision making (see chart for details).    Discussed possible inflammation causing pain. Start mobic and prednisone as directed. Ice compress, elevation, ankle brace during activity. Will have patient follow up with PCP/podiatry if symptoms not improving. Patient expresses understanding and agrees to plan.   Final Clinical Impressions(s) / UC Diagnoses   Final diagnoses:  Acute left ankle pain    ED Discharge Orders        Ordered    meloxicam (MOBIC) 7.5 MG tablet  Daily     01/09/18 1641    predniSONE (DELTASONE) 20 MG tablet  Daily     01/09/18 1641        Ok Edwards, PA-C 01/09/18 1650

## 2018-01-09 NOTE — ED Triage Notes (Signed)
Pt here for left pain in the achilles. Denies specific injury. She noticed an increase in the size of the left compared to the right. She is currently wearing a brace.

## 2018-02-02 ENCOUNTER — Ambulatory Visit
Admission: RE | Admit: 2018-02-02 | Discharge: 2018-02-02 | Disposition: A | Discharge status: Home / Self Care | Payer: 59 | Source: Ambulatory Visit | Attending: Internal Medicine | Admitting: Internal Medicine

## 2018-02-02 DIAGNOSIS — Z1231 Encounter for screening mammogram for malignant neoplasm of breast: Secondary | ICD-10-CM

## 2018-03-19 ENCOUNTER — Ambulatory Visit (INDEPENDENT_AMBULATORY_CARE_PROVIDER_SITE_OTHER): Payer: 59 | Admitting: Family Medicine

## 2018-03-19 ENCOUNTER — Other Ambulatory Visit: Payer: Self-pay

## 2018-03-19 ENCOUNTER — Encounter: Payer: Self-pay | Admitting: Family Medicine

## 2018-03-19 VITALS — BP 126/85 | HR 77 | Temp 98.3°F | Resp 18 | Ht 69.0 in | Wt 230.8 lb

## 2018-03-19 DIAGNOSIS — J4520 Mild intermittent asthma, uncomplicated: Secondary | ICD-10-CM | POA: Diagnosis not present

## 2018-03-19 DIAGNOSIS — K449 Diaphragmatic hernia without obstruction or gangrene: Secondary | ICD-10-CM

## 2018-03-19 DIAGNOSIS — R1013 Epigastric pain: Secondary | ICD-10-CM

## 2018-03-19 DIAGNOSIS — K219 Gastro-esophageal reflux disease without esophagitis: Secondary | ICD-10-CM | POA: Diagnosis not present

## 2018-03-19 MED ORDER — OMEPRAZOLE 20 MG PO CPDR: 20.0000 mg | DELAYED_RELEASE_CAPSULE | Freq: Every day | ORAL | 3 refills | Status: DC

## 2018-03-19 MED ORDER — SUCRALFATE 1 G PO TABS: 1.0000 g | ORAL_TABLET | Freq: Three times a day (TID) | ORAL | 0 refills | Status: AC

## 2018-03-19 NOTE — Patient Instructions (Addendum)
IF you received an x-ray today, you will receive an invoice from [facility]. Please contact [facility] at [phone] with questions or concerns regarding your invoice.   IF you received labwork today, you will receive an invoice from [facility]. Please contact [facility] at [phone] with questions or concerns regarding your invoice.   Our billing staff will not be able to assist you with questions regarding bills from these companies.  You will be contacted with the lab results as soon as they are available. The fastest way to get your results is to activate your My Chart account. Instructions are located on the last page of this paperwork. If you have not heard from [place] regarding the results in 2 weeks, please contact this office.       Hiatal Hernia A hiatal hernia occurs when part of the stomach slides above the muscle that separates the abdomen from the chest (diaphragm). A person can be born with a hiatal hernia (congenital), or it may develop over time. In almost all cases of hiatal hernia, only the top part of the stomach pushes through the diaphragm. Many people have a hiatal hernia with no symptoms. The larger the hernia, the more likely it is that you will have symptoms. In some cases, a hiatal hernia allows stomach acid to flow back into the tube that carries food from your mouth to your stomach (esophagus). This may cause heartburn symptoms. Severe heartburn symptoms may mean that you have developed a condition called gastroesophageal reflux disease (GERD). What are the causes? This condition is caused by a weakness in the opening (hiatus) where the esophagus passes through the diaphragm to attach to the upper part of the stomach. A person may be born with a weakness in the hiatus, or a weakness can develop over time. What increases the risk? This condition is more likely to develop in:  Older people. Age is a major risk factor for a hiatal hernia,  especially if you are over the age of 37.  Pregnant women.  People who are overweight.  People who have frequent constipation.  What are the signs or symptoms? Symptoms of this condition usually develop in the form of GERD symptoms. Symptoms include:  Heartburn.  Belching.  Indigestion.  Trouble swallowing.  Coughing or wheezing.  Sore throat.  Hoarseness.  Chest pain.  Nausea and vomiting.  How is this diagnosed? This condition may be diagnosed during testing for GERD. Tests that may be done include:  X-rays of your stomach or chest.  An upper gastrointestinal (GI) series. This is an X-ray exam of your GI tract that is taken after you swallow a chalky liquid that shows up clearly on the X-ray.  Endoscopy. This is a procedure to look into your stomach using a thin, flexible tube that has a tiny camera and light on the end of it.  How is this treated? This condition may be treated by:  Dietary and lifestyle changes to help reduce GERD symptoms.  Medicines. These may include: ? Over-the-counter antacids. ? Medicines that make your stomach empty more quickly. ? Medicines that block the production of stomach acid (H2 blockers). ? Stronger medicines to reduce stomach acid (proton pump inhibitors).  Surgery to repair the hernia, if other treatments are not helping.  If you have no symptoms, you may not need treatment. Follow these instructions at home: Lifestyle and activity  Do not use any products that contain nicotine or tobacco, such as cigarettes and e-cigarettes. If  you need help quitting, ask your health care provider.  Try to achieve and maintain a healthy body weight.  Avoid putting pressure on your abdomen. Anything that puts pressure on your abdomen increases the amount of acid that may be pushed up into your esophagus. ? Avoid bending over, especially after eating. ? Raise the head of your bed by putting blocks under the legs. This keeps your head  and esophagus higher than your stomach. ? Do not wear tight clothing around your chest or stomach. ? Try not to strain when having a bowel movement, when urinating, or when lifting heavy objects. Eating and drinking  Avoid foods that can worsen GERD symptoms. These may include: ? Fatty foods, like fried foods. ? Citrus fruits, like oranges or lemon. ? Other foods and drinks that contain acid, like orange juice or tomatoes. ? Spicy food. ? Chocolate.  Eat frequent small meals instead of three large meals a day. This helps prevent your stomach from getting too full. ? Eat slowly. ? Do not lie down right after eating. ? Do not eat 1-2 hours before bed.  Do not drink beverages with caffeine. These include cola, coffee, cocoa, and tea.  Do not drink alcohol. General instructions  Take over-the-counter and prescription medicines only as told by your health care provider.  Keep all follow-up visits as told by your health care provider. This is important. Contact a health care provider if:  Your symptoms are not controlled with medicines or lifestyle changes.  You are having trouble swallowing.  You have coughing or wheezing that will not go away. Get help right away if:  Your pain is getting worse.  Your pain spreads to your arms, neck, jaw, teeth, or back.  You have shortness of breath.  You sweat for no reason.  You feel sick to your stomach (nauseous) or you vomit.  You vomit blood.  You have bright red blood in your stools.  You have black, tarry stools. This information is not intended to replace advice given to you by your health care provider. Make sure you discuss any questions you have with your health care provider. Document Released: 11/21/2003 Document Revised: 08/24/2016 Document Reviewed: 08/24/2016 Elsevier Interactive Patient Education  [employer].

## 2018-03-19 NOTE — Progress Notes (Signed)
Chief Complaint  Patient presents with  . Abdominal Pain    started after july 4th  . Emesis    been vomiting for x2 days;      HPI  Epigastric Pain GERD- acute flare Pt is s/p lab chole for symptomatic cholelithiasis in 2002 She reports that she also has GERD and Asthma  She reports that she has been having intermittent bouts of reflux for years and occasional nausea if she eats greasy foods, sweets or fried foods. She reports that she has been doing well until she went to a family cook out for 03/17/18 She states that after the cookout she was in pain and threw up later that night  She took omeprazole and tylenol without improvement This morning she had a bm that was normal and her symptoms subsided.  Her pain is 2/10 in the epigastrium She reports that the reflux causes her to feel short of breath.   Possible hernia She reports that when she gets symptoms she also massages the bulge in her stomach and this provides relief. She denies frequent vomiting episodes.  She denies persistent nausea She denies previous EGD  Past Medical History:  Diagnosis Date  . Cancer ([place])   . Hypertension     Current Outpatient Medications  Medication Sig Dispense Refill  . fluticasone (FLONASE) 50 MCG/ACT nasal spray Place 2 sprays daily into both nostrils. 1 g 5  . montelukast (SINGULAIR) 10 MG tablet Take 1 tablet (10 mg total) by mouth at bedtime. 90 tablet 3  . simvastatin (ZOCOR) 20 MG tablet     . EPINEPHrine 0.3 mg/0.3 mL IJ SOAJ injection Inject 0.3 mLs (0.3 mg total) into the muscle once for 1 dose. 2 Device 1  . omeprazole (PRILOSEC) 20 MG capsule Take 1 capsule (20 mg total) by mouth daily. 30 capsule 3  . sucralfate (CARAFATE) 1 g tablet Take 1 tablet (1 g total) by mouth 4 (four) times daily -  with meals and at bedtime for 7 days. 28 tablet 0   No current facility-administered medications for this visit.     Allergies:  Allergies  Allergen Reactions  . Shellfish Allergy  Anaphylaxis  . Codeine     [name]  . Iohexol Hives  . Penicillins Rash    Past Surgical History:  Procedure Laterality Date  . ABDOMINAL HYSTERECTOMY      Social History   Socioeconomic History  . Marital status: Single    Spouse name: Not on file  . Number of children: Not on file  . Years of education: Not on file  . Highest education level: Not on file  Occupational History  . Not on file  Social Needs  . Financial resource strain: Not on file  . Food insecurity:    Worry: Not on file    Inability: Not on file  . Transportation needs:    Medical: Not on file    Non-medical: Not on file  Tobacco Use  . Smoking status: Former Smoker    Packs/day: 2.00    Types: Cigarettes    Last attempt to quit: 09/14/1988    Years since quitting: 29.5  . Smokeless tobacco: Former Network engineer and Sexual Activity  . Alcohol use: No    Alcohol/week: 0.0 oz  . Drug use: No  . Sexual activity: Not Currently  Lifestyle  . Physical activity:    Days per week: Not on file    Minutes per session:  Not on file  . Stress: Not on file  Relationships  . Social connections:    Talks on phone: Not on file    Gets together: Not on file    Attends religious service: Not on file    Active member of club or organization: Not on file    Attends meetings of clubs or organizations: Not on file    Relationship status: Not on file  Other Topics Concern  . Not on file  Social History Narrative  . Not on file    Family History  Problem Relation Age of Onset  . Allergic rhinitis Daughter   . Angioedema Neg Hx   . Asthma Neg Hx   . Eczema Neg Hx   . Immunodeficiency Neg Hx   . Urticaria Neg Hx      ROS Review of Systems See HPI Constitution: No fevers or chills No malaise No diaphoresis Skin: No rash or itching Eyes: no blurry vision, no double vision GU: no dysuria or hematuria Neuro: no dizziness or headaches all others reviewed and negative     Objective: Vitals:   03/19/18 1326  BP: 126/85  Pulse: 77  Resp: 18  Temp: 98.3 F (36.8 C)  TempSrc: Oral  SpO2: 98%  Weight: 230 lb 12.8 oz (104.7 kg)  Height: 5\' 9"  (1.753 m)    Physical Exam Physical Exam  Constitutional: She is oriented to person, place, and time. She appears well-developed and well-nourished.  HENT:  Head: Normocephalic and atraumatic.  Eyes: Conjunctivae and EOM are normal.  Cardiovascular: Normal rate, regular rhythm and normal heart sounds.   Pulmonary/Chest: Effort normal and breath sounds normal. No respiratory distress. She has no wheezes.  Abdominal: Normal appearance and bowel sounds are normal. There is no tenderness. There is no CVA tenderness.  mild epigastric tenderness. No rebound, no guarding.  Neurological: She is alert and oriented to person, place, and time.    Assessment and Plan Rennie was seen today for abdominal pain and emesis.  Diagnoses and all orders for this visit:   Mild intermittent asthma without complication -  May  Need to increase asthma meds as the reflux can flare up the asthma substantially  Epigastric discomfort Hiatal hernia Gastroesophageal reflux disease without esophagitis -     EGD With Biopsy; Future  Discussed medications such as PPI and carafate Advised to get EGD to figure out if there is a hernia Continue to eat small meals and drink plenty of water  Other orders -     omeprazole (PRILOSEC) 20 MG capsule; Take 1 capsule (20 mg total) by mouth daily. -     sucralfate (CARAFATE) 1 g tablet; Take 1 tablet (1 g total) by mouth 4 (four) times daily -  with meals and at bedtime for 7 days.  A total of 25 minutes were spent face-to-face with the patient during this encounter and over half of that time was spent on counseling and coordination of care.    [place]

## 2018-03-23 ENCOUNTER — Telehealth: Payer: Self-pay | Admitting: Family Medicine

## 2018-03-23 DIAGNOSIS — K449 Diaphragmatic hernia without obstruction or gangrene: Secondary | ICD-10-CM

## 2018-03-23 DIAGNOSIS — R1013 Epigastric pain: Secondary | ICD-10-CM

## 2018-03-23 DIAGNOSIS — J4520 Mild intermittent asthma, uncomplicated: Secondary | ICD-10-CM

## 2018-03-23 DIAGNOSIS — K219 Gastro-esophageal reflux disease without esophagitis: Secondary | ICD-10-CM

## 2018-03-23 NOTE — Telephone Encounter (Signed)
Carly Simmons from [facility] called stating pt was calling to inquire about EGD test that was discussed at last visit. I did not see order in pt's chart. Is this supposed to be a referral to GI or a test to be done by itself? Please advise. I told Carly Simmons we would give pt a call with next steps. Thanks!

## 2018-03-25 NOTE — Telephone Encounter (Signed)
Please place referral and we will send. Thanks!

## 2018-03-25 NOTE — Telephone Encounter (Signed)
Pt is needing referral for procedure

## 2018-04-18 NOTE — Telephone Encounter (Signed)
Referral has been placed. 

## 2018-06-16 ENCOUNTER — Other Ambulatory Visit: Payer: Self-pay | Admitting: Family Medicine

## 2018-08-12 DIAGNOSIS — Z0271 Encounter for disability determination: Secondary | ICD-10-CM

## 2018-11-23 ENCOUNTER — Other Ambulatory Visit: Payer: Self-pay

## 2018-11-23 ENCOUNTER — Emergency Department (HOSPITAL_COMMUNITY)
Admission: EM | Admit: 2018-11-23 | Discharge: 2018-11-23 | Disposition: A | Discharge status: Home / Self Care | Payer: Medicare Other | Attending: Emergency Medicine | Admitting: Emergency Medicine

## 2018-11-23 ENCOUNTER — Encounter (HOSPITAL_COMMUNITY): Payer: Self-pay | Admitting: *Deleted

## 2018-11-23 DIAGNOSIS — Z87891 Personal history of nicotine dependence: Secondary | ICD-10-CM | POA: Diagnosis not present

## 2018-11-23 DIAGNOSIS — R05 Cough: Secondary | ICD-10-CM | POA: Diagnosis present

## 2018-11-23 DIAGNOSIS — J069 Acute upper respiratory infection, unspecified: Secondary | ICD-10-CM | POA: Insufficient documentation

## 2018-11-23 LAB — CBG MONITORING, ED: Glucose-Capillary: 116 mg/dL — ABNORMAL HIGH (ref 70–99)

## 2018-11-23 NOTE — ED Triage Notes (Signed)
Pt reports cold symptoms since Monday night. No distress noted.

## 2018-11-23 NOTE — ED Provider Notes (Signed)
[facility] EMERGENCY DEPARTMENT Provider Note   CSN: [id number] Arrival date & time: 11/23/18  0709    History   Chief Complaint Chief Complaint  Patient presents with  . URI    HPI Carly Simmons is a 53 y.o. female.     53 year old female presents with complaint of cough, congestion, body aches.  Patient states symptoms started 2 days ago, she was eating out at a [employer] and ate an egg roll and felt like her throat was scratchy so she used her EpiPen which she carries for a shellfish allergy.  Patient states she later developed her cold symptoms and was not sure if they were related to her allergic reaction.  Patient has been using Flonase, Mucinex, Coricidin HBP, her inhalers as prescribed with relief of her symptoms.  She states that she is also been very thirsty and drinking a lot of water recently, no history of diabetes, she is on metformin for weight loss.  Patient denies fevers, sweats, chills, shortness of breath, wheezing, difficulty breathing.  No known sick contacts, no recent travel, no contact with those who have recently traveled.  No other complaints or concerns.     Past Medical History:  Diagnosis Date  . Cancer ([place])   . Hypertension     Patient Active Problem List   Diagnosis Date Noted  . Moderate persistent asthma without complication 16/03/3709  . Other seasonal allergic rhinitis 12/30/2017  . 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 Nasal congestion 07/29/2017  . Seasonal allergies 07/29/2017    Past Surgical History:  Procedure Laterality Date  . ABDOMINAL HYSTERECTOMY       OB History   No obstetric history on file.      Home Medications    Prior to Admission medications   Medication Sig Start Date End Date Taking? Authorizing Provider  EPINEPHrine 0.3 mg/0.3 mL IJ SOAJ injection Inject 0.3 mLs (0.3 mg total) into the muscle once for 1 dose. 12/30/17 12/30/17  Valentina Shaggy, MD   fluticasone [facility]) 50 MCG/ACT nasal spray Place 2 sprays daily into both nostrils. 62/69/48   Defelice, Jeanett Schlein, NP  montelukast (SINGULAIR) 10 MG tablet Take 1 tablet (10 mg total) by mouth at bedtime. 12/30/17   Valentina Shaggy, MD  omeprazole (PRILOSEC) 20 MG capsule TAKE 1 CAPSULE BY MOUTH EVERY DAY 06/16/18   Forrest Moron, MD  simvastatin (ZOCOR) 20 MG tablet  12/24/17   [provider]  sucralfate (CARAFATE) 1 g tablet Take 1 tablet (1 g total) by mouth 4 (four) times daily -  with meals and at bedtime for 7 days. 03/19/18 03/26/18  Forrest Moron, MD    Family History Family History  Problem Relation Age of Onset  . Allergic rhinitis Daughter   . Angioedema Neg Hx   . Asthma Neg Hx   . Eczema Neg Hx   . Immunodeficiency Neg Hx   . Urticaria Neg Hx     Social History Social History   Tobacco Use  . Smoking status: Former Smoker    Packs/day: 2.00    Types: Cigarettes    Last attempt to quit: 09/14/1988    Years since quitting: 30.2  . Smokeless tobacco: Former Network engineer Use Topics  . Alcohol use: No    Alcohol/week: 0.0 standard drinks  . Drug use: No     Allergies   Shellfish allergy; Codeine; Iohexol; and Penicillins   Review of Systems Review of  Systems  Constitutional: Negative for fever.  HENT: Positive for congestion. Negative for sore throat.   Respiratory: Positive for cough. Negative for shortness of breath.   Gastrointestinal: Negative for nausea and vomiting.  Endocrine: Positive for polydipsia.  Musculoskeletal: Positive for arthralgias and myalgias.  Skin: Negative for rash and wound.  Allergic/Immunologic: Negative for immunocompromised state.  Psychiatric/Behavioral: Negative for confusion.  All other systems reviewed and are negative.    Physical Exam Updated Vital Signs BP 121/76 (BP Location: Right Arm)   Pulse (!) 102   Temp 98.8 F (37.1 C) (Oral)   Resp 18   LMP 06/21/2012   SpO2 96%   Physical Exam  Vitals signs and nursing note reviewed.  Constitutional:      General: She is not in acute distress.    Appearance: She is well-developed. She is not diaphoretic.  HENT:     Head: Normocephalic and atraumatic.     Right Ear: Tympanic membrane and ear canal normal.     Left Ear: Tympanic membrane and ear canal normal.     Nose: Congestion present.     Mouth/Throat:     Mouth: Mucous membranes are moist.     Pharynx: No oropharyngeal exudate or posterior oropharyngeal erythema.  Eyes:     Conjunctiva/sclera: Conjunctivae normal.  Neck:     Musculoskeletal: Neck supple.  Cardiovascular:     Rate and Rhythm: Normal rate and regular rhythm.     Pulses: Normal pulses.     Heart sounds: Normal heart sounds. No murmur.  Pulmonary:     Effort: Pulmonary effort is normal.     Breath sounds: Normal breath sounds. No wheezing.  Lymphadenopathy:     Cervical: No cervical adenopathy.  Skin:    General: Skin is warm and dry.     Findings: No erythema or rash.  Neurological:     Mental Status: She is alert and oriented to person, place, and time.  Psychiatric:        Behavior: Behavior normal.      ED Treatments / Results  Labs (all labs ordered are listed, but only abnormal results are displayed) Labs Reviewed  CBG MONITORING, ED - Abnormal; Notable for the following components:      Result Value   Glucose-Capillary 116 (*)    All other components within normal limits    EKG None  Radiology No results found.  Procedures Procedures (including critical care time)  Medications Ordered in ED Medications - No data to display   Initial Impression / Assessment and Plan / ED Course  I have reviewed the triage vital signs and the nursing notes.  Pertinent labs & imaging results that were available during my care of the patient were reviewed by me and considered in my medical decision making (see chart for details).  Clinical Course as of Nov 22 925  Wed Mar 11, 399  2145  53 year old female presents with complaint of body aches with cough and congestion and polydipsia.  Patient CBG is 116, do not suspect this is due to elevated blood sugar, suggested she may be very thirsty due to taking cold medication.  Patient is managing well with her prescription and OTC medications, recommend that she continue with these medications, return to ER for severe or concerning symptoms or see her PCP.  Patient is agreeable with discharge instructions and plan.   [LM]    Clinical Course User Index [LM] Tacy Learn, PA-C   Final Clinical Impressions(s) / ED  Diagnoses   Final diagnoses:  Viral upper respiratory tract infection    ED Discharge Orders    None       Tacy Learn, PA-C 11/23/18 7741    Varney Biles, MD 11/25/18 2878

## 2018-11-23 NOTE — Discharge Instructions (Addendum)
Continue with regimen of medications as you have been taking it. Recommend saline sinus rinse twice daily followed by your Flonase. Zyrtec daily. Coricidin HBP for symptom relief.  You can consider whether or not you want a continue with Mucinex as this medication works by increasing mucus in the body to help thin it out and make it easier to cough up. Continue with inhaler use. Return to ER for severe or concerning symptoms otherwise follow-up with your doctor if needed.

## 2018-12-26 ENCOUNTER — Other Ambulatory Visit: Payer: Self-pay | Admitting: Family Medicine

## 2018-12-26 DIAGNOSIS — Z1231 Encounter for screening mammogram for malignant neoplasm of breast: Secondary | ICD-10-CM

## 2019-02-18 ENCOUNTER — Other Ambulatory Visit: Payer: Self-pay

## 2019-03-27 ENCOUNTER — Ambulatory Visit: Payer: Medicare Other

## 2019-05-05 ENCOUNTER — Ambulatory Visit
Admission: RE | Admit: 2019-05-05 | Discharge: 2019-05-05 | Disposition: A | Discharge status: Home / Self Care | Payer: Medicare Other | Source: Ambulatory Visit | Attending: Family Medicine | Admitting: Family Medicine

## 2019-05-05 ENCOUNTER — Other Ambulatory Visit: Payer: Self-pay

## 2019-05-05 DIAGNOSIS — Z1231 Encounter for screening mammogram for malignant neoplasm of breast: Secondary | ICD-10-CM

## 2019-11-20 ENCOUNTER — Emergency Department (HOSPITAL_COMMUNITY)
Admission: EM | Admit: 2019-11-20 | Discharge: 2019-11-20 | Disposition: A | Discharge status: Home / Self Care | Payer: Medicare Other | Attending: Emergency Medicine | Admitting: Emergency Medicine

## 2019-11-20 ENCOUNTER — Other Ambulatory Visit: Payer: Self-pay

## 2019-11-20 ENCOUNTER — Encounter (HOSPITAL_COMMUNITY): Payer: Self-pay | Admitting: Emergency Medicine

## 2019-11-20 DIAGNOSIS — I10 Essential (primary) hypertension: Secondary | ICD-10-CM | POA: Insufficient documentation

## 2019-11-20 DIAGNOSIS — Z87891 Personal history of nicotine dependence: Secondary | ICD-10-CM | POA: Insufficient documentation

## 2019-11-20 DIAGNOSIS — H1131 Conjunctival hemorrhage, right eye: Secondary | ICD-10-CM | POA: Diagnosis not present

## 2019-11-20 DIAGNOSIS — Z859 Personal history of malignant neoplasm, unspecified: Secondary | ICD-10-CM | POA: Diagnosis not present

## 2019-11-20 DIAGNOSIS — E876 Hypokalemia: Secondary | ICD-10-CM | POA: Insufficient documentation

## 2019-11-20 DIAGNOSIS — Z79899 Other long term (current) drug therapy: Secondary | ICD-10-CM | POA: Diagnosis not present

## 2019-11-20 DIAGNOSIS — H579 Unspecified disorder of eye and adnexa: Secondary | ICD-10-CM | POA: Diagnosis present

## 2019-11-20 DIAGNOSIS — Z91040 Latex allergy status: Secondary | ICD-10-CM | POA: Insufficient documentation

## 2019-11-20 DIAGNOSIS — G514 Facial myokymia: Secondary | ICD-10-CM | POA: Diagnosis not present

## 2019-11-20 DIAGNOSIS — R519 Headache, unspecified: Secondary | ICD-10-CM | POA: Diagnosis not present

## 2019-11-20 LAB — CBC WITH DIFFERENTIAL/PLATELET
Abs Immature Granulocytes: 0.01 10*3/uL (ref 0.00–0.07)
Basophils Absolute: 0 10*3/uL (ref 0.0–0.1)
Basophils Relative: 1 %
Eosinophils Absolute: 0.1 10*3/uL (ref 0.0–0.5)
Eosinophils Relative: 1 %
HCT: 37.4 % (ref 36.0–46.0)
Hemoglobin: 11.6 g/dL — ABNORMAL LOW (ref 12.0–15.0)
Immature Granulocytes: 0 %
Lymphocytes Relative: 38 %
Lymphs Abs: 2.5 10*3/uL (ref 0.7–4.0)
MCH: 27.5 pg (ref 26.0–34.0)
MCHC: 31 g/dL (ref 30.0–36.0)
MCV: 88.6 fL (ref 80.0–100.0)
Monocytes Absolute: 0.4 10*3/uL (ref 0.1–1.0)
Monocytes Relative: 6 %
Neutro Abs: 3.6 10*3/uL (ref 1.7–7.7)
Neutrophils Relative %: 54 %
Platelets: 218 10*3/uL (ref 150–400)
RBC: 4.22 MIL/uL (ref 3.87–5.11)
RDW: 13.5 % (ref 11.5–15.5)
WBC: 6.6 10*3/uL (ref 4.0–10.5)
nRBC: 0 % (ref 0.0–0.2)

## 2019-11-20 LAB — BASIC METABOLIC PANEL
Anion gap: 9 (ref 5–15)
BUN: 14 mg/dL (ref 6–20)
CO2: 28 mmol/L (ref 22–32)
Calcium: 9.9 mg/dL (ref 8.9–10.3)
Chloride: 104 mmol/L (ref 98–111)
Creatinine, Ser: 0.84 mg/dL (ref 0.44–1.00)
GFR calc Af Amer: >60 mL/min (ref >60)
GFR calc non Af Amer: >60 mL/min (ref >60)
Glucose, Bld: 89 mg/dL (ref 70–99)
Potassium: 3.4 mmol/L — ABNORMAL LOW (ref 3.5–5.1)
Sodium: 141 mmol/L (ref 135–145)

## 2019-11-20 MED ORDER — FLUORESCEIN SODIUM 1 MG OP STRP
1.0000 | ORAL_STRIP | Freq: Once | OPHTHALMIC | Status: AC
  Administered 2019-11-20: 1 via OPHTHALMIC
  Filled 2019-11-20: qty 1

## 2019-11-20 MED ORDER — TETRACAINE HCL 0.5 % OP SOLN
1.0000 [drp] | Freq: Once | OPHTHALMIC | Status: AC
  Administered 2019-11-20: 1 [drp] via OPHTHALMIC
  Filled 2019-11-20: qty 4

## 2019-11-20 MED ORDER — POTASSIUM CHLORIDE CRYS ER 20 MEQ PO TBCR
40.0000 meq | EXTENDED_RELEASE_TABLET | Freq: Once | ORAL | Status: AC
  Administered 2019-11-20: 40 meq via ORAL
  Filled 2019-11-20: qty 2

## 2019-11-20 NOTE — ED Provider Notes (Signed)
[facility] EMERGENCY DEPARTMENT Provider Note   CSN: [id number] Arrival date & time: 11/20/19  1352     History Chief Complaint  Patient presents with  . Eye Problem    Carly Simmons is a 54 y.o. female with history of hypertension, seasonal allergies presents for evaluation of acute onset, progressively worsening right eye redness for 4 days.  Reports that when she woke on Friday she noticed some redness to the inner corner of the right eye.  Has been applying pink eyedrops and over-the-counter eyedrops without relief.  Today he awoke and noticed the redness had worsened.  She denies any vision changes, eye drainage, eye trauma, or eye pain.  No pain with eye movements or swelling of eyelids.  Denies fevers.  She is a contact lens wearer but has not been wearing her contact lenses while this has been occurring.  She has an appointment to see an optometrist on Friday.  She also notes that over the last several days she has felt as though her she has been "smacking her lips" uncontrollably.  Just prior to my assessment she reports development of a throbbing right-sided temporal headache with numbness radiating to the preauricular space and down the neck.  Reports the headache itself is quite mild and that it resolved while I was assessing her.  Denies any new medication changes.     The history is provided by the patient.       Past Medical History:  Diagnosis Date  . Cancer ([place])   . Hypertension     Patient Active Problem List   Diagnosis Date Noted  . Moderate persistent asthma without complication [id number]  . Other seasonal allergic rhinitis 12/30/2017  . 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 Nasal congestion 07/29/2017  . Seasonal allergies 07/29/2017    Past Surgical History:  Procedure Laterality Date  . ABDOMINAL HYSTERECTOMY       OB History   No obstetric history on file.     Family History  Problem Relation Age of  Onset  . Allergic rhinitis Daughter   . Angioedema Neg Hx   . Asthma Neg Hx   . Eczema Neg Hx   . Immunodeficiency Neg Hx   . Urticaria Neg Hx     Social History   Tobacco Use  . Smoking status: Former Smoker    Packs/day: 2.00    Types: Cigarettes    Quit date: 09/14/1988    Years since quitting: 31.2  . Smokeless tobacco: Former Network engineer Use Topics  . Alcohol use: No    Alcohol/week: 0.0 standard drinks  . Drug use: No    Home Medications Prior to Admission medications   Medication Sig Start Date End Date Taking? Authorizing Provider  albuterol (PROAIR HFA) 108 (90 Base) MCG/ACT inhaler Inhale 2 puffs into the lungs every 6 (six) hours as needed for wheezing or shortness of breath.   Yes [provider]  Ascorbic Acid (VITAMIN C GUMMIES PO) Take 1-2 tablets by mouth daily.   Yes [provider]  aspirin (BAYER ASPIRIN EC LOW DOSE) 81 MG EC tablet Take 81 mg by mouth daily. Swallow whole.   Yes [provider]  atorvastatin (LIPITOR) 10 MG tablet Take 10 mg by mouth daily.   Yes [provider]  Cholecalciferol (VITAMIN D3) 50 MCG (2000 UT) TABS Take 2,000 Units by mouth daily.   Yes [provider]  Cyanocobalamin (VITAMIN B-12 PO) Take 1 tablet  by mouth daily.   Yes [provider]  ELDERBERRY PO Take 1-2 tablets by mouth daily.    Yes [provider]  EPINEPHrine 0.3 mg/0.3 mL IJ SOAJ injection Inject 0.3 mLs (0.3 mg total) into the muscle once for 1 dose. 12/30/17 11/20/19 Yes Valentina Shaggy, MD  fenofibrate (TRICOR) 145 MG tablet Take 145 mg by mouth daily.   Yes [provider]  fluticasone (FLONASE) 50 MCG/ACT nasal spray Place 2 sprays daily into both nostrils. 0000000  Yes Defelice, Jeanett Schlein, NP  levocetirizine (XYZAL) 5 MG tablet Take 5 mg by mouth daily.   Yes [provider]  losartan-hydrochlorothiazide (HYZAAR) 100-12.5 MG tablet Take 1 tablet by mouth daily.   Yes [provider]  montelukast (SINGULAIR) 10 MG tablet Take 1 tablet (10 mg total) by mouth at bedtime. Patient taking differently: Take 10 mg by mouth daily.  12/30/17  Yes Valentina Shaggy, MD  NON FORMULARY Take by mouth See admin instructions. Sea moss- Mix raw sea moss paste into smoothie and drink once daily for digestive health   Yes [provider]  sucralfate (CARAFATE) 1 g tablet Take 1 tablet (1 g total) by mouth 4 (four) times daily -  with meals and at bedtime for 7 days. Patient taking differently: Take 1 g by mouth in the morning and at bedtime.  03/19/18 11/20/19 Yes Stallings, Zoe A, MD  omeprazole (PRILOSEC) 20 MG capsule TAKE 1 CAPSULE BY MOUTH EVERY DAY Patient not taking: Reported on 11/20/2019 06/16/18   Forrest Moron, MD    Allergies    Penicillins, Shellfish allergy, Codeine, Citrus, Iohexol, Latex, Pineapple, Tomato, and Vinegar [acetic acid]  Review of Systems   Review of Systems  Constitutional: Negative for chills and fever.  HENT: Negative for ear pain and facial swelling.   Eyes: Positive for redness. Negative for photophobia, pain, discharge, itching and visual disturbance.  Respiratory: Negative for shortness of breath.   Cardiovascular: Negative for chest pain.  Gastrointestinal: Negative for abdominal pain, nausea and vomiting.  Neurological: Positive for numbness and headaches. Negative for dizziness, facial asymmetry and weakness.  All other systems reviewed and are negative.   Physical Exam Updated Vital Signs BP 111/84 (BP Location: Right Arm)   Pulse (!) 51   Temp 98.2 F (36.8 C) (Oral)   Resp 15   Ht 5\' 9"  (1.753 m)   Wt 109.3 kg   LMP 06/21/2012   SpO2 98%   BMI 35.59 kg/m   Physical Exam Vitals and nursing note reviewed.  Constitutional:      General: She is not in acute distress.    Appearance: She is well-developed.  HENT:     Head: Normocephalic and atraumatic.     Comments: No tenderness to percussion at the base of  the trigeminal nerve.  Eyes:     General:        Right eye: No discharge.        Left eye: No discharge.     Extraocular Movements: Extraocular movements intact.     Pupils: Pupils are equal, round, and reactive to light.     Comments: Subconjunctival hemorrhage noted to the nasal aspect of the right eye sclera.  No chemosis, proptosis, or consensual photophobia.  No pain or restriction with EOMs.  No periorbital swelling, erythema, or tenderness.     Visual Acuity  Right Eye Distance: 20/25 Left Eye Distance: 20/20 Bilateral Distance: 20/32 Note: not wearing corrective lenses   On  fluorescein stain, no dendritic lesions, no rust rings, no corneal abrasions or ulcerations.  Seidel sign is absent.  Neck:     Vascular: No JVD.     Trachea: No tracheal deviation.  Cardiovascular:     Rate and Rhythm: Normal rate and regular rhythm.  Pulmonary:     Effort: Pulmonary effort is normal.     Breath sounds: Normal breath sounds.  Abdominal:     General: There is no distension.  Musculoskeletal:     Cervical back: Normal range of motion and neck supple. No rigidity or tenderness.  Skin:    General: Skin is warm and dry.     Findings: No erythema.  Neurological:     General: No focal deficit present.     Mental Status: She is alert.     Comments: Mental Status:  Alert, thought content appropriate, able to give a coherent history. Speech fluent without evidence of aphasia. Able to follow 2 step commands without difficulty.  Cranial Nerves:  II:  Peripheral visual fields grossly normal, pupils equal, round, reactive to light III,IV, VI: ptosis not present, extra-ocular motions intact bilaterally  V,VII: smile symmetric, facial light touch sensation equal VIII: hearing grossly normal to voice  X: uvula elevates symmetrically  XI: bilateral shoulder shrug symmetric and strong XII: midline tongue extension without fassiculations Also noted intermittent twitching of the left upper lip,  occasional lip smacking movement but this is also intermittent.    Motor:  Normal tone. 5/5 strength of BUE and BLE major muscle groups including strong and equal grip strength and dorsiflexion/plantar flexion Sensory: light touch normal in all extremities. Cerebellar: normal finger-to-nose with bilateral upper extremities, Romberg sign absent.  Gait: normal gait and balance. Able to walk on toes and heels with ease.     Psychiatric:        Behavior: Behavior normal.     ED Results / Procedures / Treatments   Labs (all labs ordered are listed, but only abnormal results are displayed) Labs Reviewed  BASIC METABOLIC PANEL - Abnormal; Notable for the following components:      Result Value   Potassium 3.4 (*)    All other components within normal limits  CBC WITH DIFFERENTIAL/PLATELET - Abnormal; Notable for the following components:   Hemoglobin 11.6 (*)    All other components within normal limits    EKG None  Radiology No results found.  Procedures Procedures (including critical care time)  Medications Ordered in ED Medications  fluorescein ophthalmic strip 1 strip (1 strip Right Eye Given 11/20/19 1635)  tetracaine (PONTOCAINE) 0.5 % ophthalmic solution 1 drop (1 drop Left Eye Given 11/20/19 1635)  potassium chloride SA (KLOR-CON) CR tablet 40 mEq (40 mEq Oral Given 11/20/19 1836)    ED Course  I have reviewed the triage vital signs and the nursing notes.  Pertinent labs & imaging results that were available during my care of the patient were reviewed by me and considered in my medical decision making (see chart for details).    MDM Rules/Calculators/A&P                      Patient presenting for evaluation of redness to the right eye for 4 days.  No eye pain.  She then went on to describe right-sided headache that had resolved upon my assessment as well as paresthesias going down the right side of the face which is also resolved on my assessment.  She has a completely  normal neurologic examination aside from an intermittent twitch around the right side of the mouth which almost looks similar to tardive dyskinesia though she is not on any antipsychotics.  Given she has a reassuring neurologic examination and I am not concerned for CVA or temporal arteritis (given her age group, no vision changes, no tenderness to palpation overlying the temple on examination), we will hold off on any imaging at this time.  Basic lab work was obtained and reviewed and interpreted by myself which shows mild hypokalemia which was replenished orally in the ED, mild anemia.  Overall reassuring.  With regards to her eye erythema, this is most consistent with subconjunctival hemorrhage on examination.  No evidence of hyphema, ocular nerve entrapment, globe rupture, corneal abrasion or ulceration.  She has an appointment with an optometrist earlier this week and I encouraged her to keep this.  We will refer her to neurology for reevaluation of her facial twitches.  Discussed strict ED return precautions. Patient verbalized understanding of and agreement with plan and is safe for discharge home at this time.  Discussed with Dr. Laverta Baltimore who agrees with assessment and plan at this time.   Final Clinical Impression(s) / ED Diagnoses Final diagnoses:  Subconjunctival hemorrhage of right eye  Facial twitching  Hypokalemia    Rx / DC Orders ED Discharge Orders         Ordered    Ambulatory referral to Neurology    Comments: An appointment is requested in approximately: 2 weeks   11/20/19 1828           Renita Papa, PA-C 11/20/19 2153    Margette Fast, MD 11/21/19 [phone]

## 2019-11-20 NOTE — ED Triage Notes (Signed)
Pt c/o redness to right eye that started on Friday. Denies pain/irritation or problems with vision.

## 2019-11-20 NOTE — Discharge Instructions (Signed)
Continue taking all your medicines as prescribed.  The blood under the conjunctive of the eyes should resolve within the next week.  You can apply cool compresses to the eye if you would like or take Tylenol as needed for any aches or pains.  1 to 2 tablets every 6 hours as needed.  I would not recommend wearing your contact lenses until your eye redness resolves.  Follow-up with your optometrist on Friday as scheduled.  Follow-up with neurology for reevaluation of the numbness and headaches and facial twitching.  Return to the emergency department if any concerning signs or symptoms develop such as severe headaches, vision loss, severe pain, weakness to 1 side of the body, fevers, neck stiffness, or loss of consciousness

## 2019-11-20 NOTE — ED Notes (Signed)
This EMT looked for a vein to stick the pt but did not see a vein to stick, so no blood was drawn.

## 2019-11-20 NOTE — ED Notes (Signed)
Pt verbalized understanding of discharge instructions. Follow up care and pain management reviewed. PT had no further questions

## 2019-11-20 NOTE — ED Notes (Signed)
Pt reports redness in R eye has increased in size since Friday. Reports pain to R temple and R ear.

## 2020-01-11 NOTE — Progress Notes (Signed)
NEUROLOGY CONSULTATION NOTE  Carly Simmons MRN: [id number] DOB: [birth year]  Referring provider: Rodell Perna, PA-C (ED referral) Primary care provider: No PCP  Reason for consult:  Facial twitching  HISTORY OF PRESENT ILLNESS: Carly Simmons is a 54 year old right-handed black female who presents for facial twitching.  History supplemented by ED note.  In March, she was seen in the ED for right eye subconjunctival hemorrhage.  No associated visual disturbance or eye pain but did endorse some pain in the right jaw, like a toothache.  It was noted that the right lower facial twitching and unintentional mouth movements.   She was referred for evaluation.  She is uncertain how long it has been going on as she really doesn't notice it or is bothered by it.  Since then, eye has cleared and no longer with pain.     She has history of drug abuse, Bipolar disorder and schizophrenia and was on antipsychotic medications many years ago.  She is currently not on any psychotropic drugs.  PAST MEDICAL HISTORY: Past Medical History:  Diagnosis Date  . Cancer ([place])   . Hypertension     PAST SURGICAL HISTORY: Past Surgical History:  Procedure Laterality Date  . ABDOMINAL HYSTERECTOMY      MEDICATIONS: Current Outpatient Medications on File Prior to Visit  Medication Sig Dispense Refill  . albuterol (PROAIR HFA) 108 (90 Base) MCG/ACT inhaler Inhale 2 puffs into the lungs every 6 (six) hours as needed for wheezing or shortness of breath.    . Ascorbic Acid (VITAMIN C GUMMIES PO) Take 1-2 tablets by mouth daily.    Marland Kitchen aspirin (BAYER ASPIRIN EC LOW DOSE) 81 MG EC tablet Take 81 mg by mouth daily. Swallow whole.    Marland Kitchen atorvastatin (LIPITOR) 10 MG tablet Take 10 mg by mouth daily.    . Cholecalciferol (VITAMIN D3) 50 MCG (2000 UT) TABS Take 2,000 Units by mouth daily.    . Cyanocobalamin (VITAMIN B-12 PO) Take 1 tablet by mouth daily.    Marland Kitchen ELDERBERRY PO Take 1-2 tablets by mouth daily.     Marland Kitchen EPINEPHrine  0.3 mg/0.3 mL IJ SOAJ injection Inject 0.3 mLs (0.3 mg total) into the muscle once for 1 dose. 2 Device 1  . fenofibrate (TRICOR) 145 MG tablet Take 145 mg by mouth daily.    . fluticasone (FLONASE) 50 MCG/ACT nasal spray Place 2 sprays daily into both nostrils. 1 g 5  . levocetirizine (XYZAL) 5 MG tablet Take 5 mg by mouth daily.    Marland Kitchen losartan-hydrochlorothiazide (HYZAAR) 100-12.5 MG tablet Take 1 tablet by mouth daily.    . montelukast (SINGULAIR) 10 MG tablet Take 1 tablet (10 mg total) by mouth at bedtime. (Patient taking differently: Take 10 mg by mouth daily. ) 90 tablet 3  . NON FORMULARY Take by mouth See admin instructions. Sea moss- Mix raw sea moss paste into smoothie and drink once daily for digestive health    . omeprazole (PRILOSEC) 20 MG capsule TAKE 1 CAPSULE BY MOUTH EVERY DAY (Patient not taking: Reported on 11/20/2019) 90 capsule 2  . sucralfate (CARAFATE) 1 g tablet Take 1 tablet (1 g total) by mouth 4 (four) times daily -  with meals and at bedtime for 7 days. (Patient taking differently: Take 1 g by mouth in the morning and at bedtime. ) 28 tablet 0   No current facility-administered medications on file prior to visit.    ALLERGIES: Allergies  Allergen Reactions  . Penicillins Anaphylaxis and  Rash    Did it involve swelling of the face/tongue/throat, SOB, or low BP? Yes Did it involve sudden or severe rash/hives, skin peeling, or any reaction on the inside of your mouth or nose? Yes Did you need to seek medical attention at a hospital or doctor's office? Yes When did it last happen? "within the past 10 years" If all above answers are "NO", may proceed with cephalosporin use.    . Shellfish Allergy Anaphylaxis  . Codeine Other (See Comments)    [name]  . Citrus Rash and Other (See Comments)    No oranges!!  . Iohexol Hives  . Latex Rash  . Pineapple Rash  . Tomato Rash  . Vinegar [Acetic Acid] Rash and Other (See Comments)    No vinegar  and water douches!!    FAMILY HISTORY: Family History  Problem Relation Age of Onset  . Allergic rhinitis Daughter   . Angioedema Neg Hx   . Asthma Neg Hx   . Eczema Neg Hx   . Immunodeficiency Neg Hx   . Urticaria Neg Hx     SOCIAL HISTORY: Social History   Socioeconomic History  . Marital status: Single    Spouse name: Not on file  . Number of children: Not on file  . Years of education: Not on file  . Highest education level: Not on file  Occupational History  . Not on file  Tobacco Use  . Smoking status: Former Smoker    Packs/day: 2.00    Types: Cigarettes    Quit date: 09/14/1988    Years since quitting: 31.3  . Smokeless tobacco: Former Network engineer and Sexual Activity  . Alcohol use: No    Alcohol/week: 0.0 standard drinks  . Drug use: No  . Sexual activity: Not Currently  Other Topics Concern  . Not on file  Social History Narrative  . Not on file   Social Determinants of Health   Financial Resource Strain:   . Difficulty of Paying Living Expenses:   Food Insecurity:   . Worried About Charity fundraiser in the Last Year:   . Arboriculturist in the Last Year:   Transportation Needs:   . Film/video editor (Medical):   Marland Kitchen Lack of Transportation (Non-Medical):   Physical Activity:   . Days of Exercise per Week:   . Minutes of Exercise per Session:   Stress:   . Feeling of Stress :   Social Connections:   . Frequency of Communication with Friends and Family:   . Frequency of Social Gatherings with Friends and Family:   . Attends Religious Services:   . Active Member of Clubs or Organizations:   . Attends Archivist Meetings:   Marland Kitchen Marital Status:   Intimate Partner Violence:   . Fear of Current or Ex-Partner:   . Emotionally Abused:   Marland Kitchen Physically Abused:   . Sexually Abused:     PHYSICAL EXAM: Blood pressure (!) 148/96, pulse 98, height 5\' 9"  (1.753 m), weight 232 lb (105.2 kg), last menstrual period 06/21/2012, SpO2 (!) 80  %. General: No acute distress.  Patient appears well-groomed.  Head:  Normocephalic/atraumatic Eyes:  fundi examined but not visualized Neck: supple, no paraspinal tenderness, full range of motion Back: No paraspinal tenderness Heart: regular rate and rhythm Lungs: Clear to auscultation bilaterally. Vascular: No carotid bruits. Neurological Exam: Mental status: alert and oriented to person, place, and time, recent and remote  memory intact, fund of knowledge intact, attention and concentration intact, speech fluent and not dysarthric, language intact. Cranial nerves: CN I: not tested CN II: pupils equal, round and reactive to light, visual fields intact CN III, IV, VI:  full range of motion, no nystagmus, no ptosis CN V: facial sensation intact CN VII: upper and lower face symmetric CN VIII: hearing intact CN IX, X: gag intact, uvula midline CN XI: sternocleidomastoid and trapezius muscles intact CN XII: tongue midline Bulk & Tone: normal, no fasciculations. Motor:  5/5 throughout.  On rare occasion, she does exhibit some mild lip smacking. Sensation: temperature and vibration sensation intact. Deep Tendon Reflexes:  2+ throughout, toes downgoing.  Finger to nose testing:  Without dysmetria.  Heel to shin:  Without dysmetria.  Gait:  Normal station and stride.  Able to turn.  Romberg negative.  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  No concerning abnormalities on neurologic exam.  It is not bothersome to the patient.  No further workup or management warranted.   Metta Clines, DO

## 2020-01-12 ENCOUNTER — Encounter: Payer: Self-pay | Admitting: Neurology

## 2020-01-12 ENCOUNTER — Other Ambulatory Visit: Payer: Self-pay

## 2020-01-12 ENCOUNTER — Ambulatory Visit (INDEPENDENT_AMBULATORY_CARE_PROVIDER_SITE_OTHER): Payer: Medicare Other | Admitting: Neurology

## 2020-01-12 VITALS — BP 148/96 | HR 98 | Ht 69.0 in | Wt 232.0 lb

## 2020-01-12 DIAGNOSIS — G244 Idiopathic orofacial dystonia: Secondary | ICD-10-CM

## 2020-01-12 NOTE — Patient Instructions (Signed)
I do notice the mouth movements but it is mild and infrequent.  It may be from the psychiatric medications that you used to take years ago.  I don't suspect anything serious.

## 2020-08-01 ENCOUNTER — Ambulatory Visit (INDEPENDENT_AMBULATORY_CARE_PROVIDER_SITE_OTHER): Payer: Medicare Other | Admitting: Podiatry

## 2020-08-01 ENCOUNTER — Ambulatory Visit (INDEPENDENT_AMBULATORY_CARE_PROVIDER_SITE_OTHER): Payer: Medicare Other

## 2020-08-01 ENCOUNTER — Other Ambulatory Visit: Payer: Self-pay

## 2020-08-01 DIAGNOSIS — M21619 Bunion of unspecified foot: Secondary | ICD-10-CM

## 2020-08-01 NOTE — Patient Instructions (Signed)
Bunion  A bunion is a bump on the base of the big toe that forms when the bones of the big toe joint move out of position. Bunions may be small at first, but they often get larger over time. They can make walking painful. What are the causes? A bunion may be caused by:  Wearing narrow or pointed shoes that force the big toe to press against the other toes.  Abnormal foot development that causes the foot to roll inward (pronate).  Changes in the foot that are caused by certain diseases, such as rheumatoid arthritis or polio.  A foot injury. What increases the risk? The following factors may make you more likely to develop this condition:  Wearing shoes that squeeze the toes together.  Having certain diseases, such as: ? Rheumatoid arthritis. ? Polio. ? Cerebral palsy.  Having family members who have bunions.  Being born with a foot deformity, such as flat feet or low arches.  Doing activities that put a lot of pressure on the feet, such as ballet dancing. What are the signs or symptoms? The main symptom of a bunion is a noticeable bump on the big toe. Other symptoms may include:  Pain.  Swelling around the big toe.  Redness and inflammation.  Thick or hardened skin on the big toe or between the toes.  Stiffness or loss of motion in the big toe.  Trouble with walking. How is this diagnosed? A bunion may be diagnosed based on your symptoms, medical history, and activities. You may have tests, such as:  X-rays. These allow your health care provider to check the position of the bones in your foot and look for damage to your joint. They also help your health care provider determine the severity of your bunion and the best way to treat it.  Joint aspiration. In this test, a sample of fluid is removed from the toe joint. This test may be done if you are in a lot of pain. It helps rule out diseases that cause painful swelling of the joints, such as arthritis. How is this  treated? Treatment depends on the severity of your symptoms. The goal of treatment is to relieve symptoms and prevent the bunion from getting worse. Your health care provider may recommend:  Wearing shoes that have a wide toe box.  Using bunion pads to cushion the affected area.  Taping your toes together to keep them in a normal position.  Placing a device inside your shoe (orthotics) to help reduce pressure on your toe joint.  Taking medicine to ease pain, inflammation, and swelling.  Applying heat or ice to the affected area.  Doing stretching exercises.  Surgery to remove scar tissue and move the toes back into their normal position. This treatment is rare. Follow these instructions at home: Managing pain, stiffness, and swelling   If directed, put ice on the painful area: ? Put ice in a plastic bag. ? Place a towel between your skin and the bag. ? Leave the ice on for 20 minutes, 2-3 times a day. Activity   If directed, apply heat to the affected area before you exercise. Use the heat source that your health care provider recommends, such as a moist heat pack or a heating pad. ? Place a towel between your skin and the heat source. ? Leave the heat on for 20-30 minutes. ? Remove the heat if your skin turns bright red. This is especially important if you are unable to feel pain,   heat, or cold. You may have a greater risk of getting burned.  Do exercises as told by your health care provider. General instructions  Support your toe joint with proper footwear, shoe padding, or taping as told by your health care provider.  Take over-the-counter and prescription medicines only as told by your health care provider.  Keep all follow-up visits as told by your health care provider. This is important. Contact a health care provider if your symptoms:  Get worse.  Do not improve in 2 weeks. Get help right away if you have:  Severe pain and trouble with walking. Summary  A  bunion is a bump on the base of the big toe that forms when the bones of the big toe joint move out of position.  Bunions can make walking painful.  Treatment depends on the severity of your symptoms.  Support your toe joint with proper footwear, shoe padding, or taping as told by your health care provider. This information is not intended to replace advice given to you by your health care provider. Make sure you discuss any questions you have with your health care provider. Document Revised: 03/07/2018 Document Reviewed: 01/11/2018 Elsevier Patient Education  2020 [employer] Inc.  

## 2020-08-02 NOTE — Progress Notes (Signed)
Subjective:   Patient ID: Carly Simmons, female   DOB: 54 y.o.   MRN: [id number]   HPI Patient presents stating that she has a very painful bunion deformity left that is been getting worse and making it harder to wear shoe gear.  Also states that she has had some pain in the plantar heel but it is better at the current time.  States it gets sore when she tries to wear shoes and she has family history of condition.  Patient does not smoke likes to be active   Review of Systems  All other systems reviewed and are negative.       Objective:  Physical Exam Vitals and nursing note reviewed.  Constitutional:      Appearance: She is well-developed.  Pulmonary:     Effort: Pulmonary effort is normal.  Musculoskeletal:        General: Normal range of motion.  Skin:    General: Skin is warm.  Neurological:     Mental Status: She is alert.     Neurovascular status intact muscle strength adequate range of motion within normal limits.  Patient is found to have hyperostosis with prominence of the first metatarsal head left that is painful when pressed and make shoe gear difficult.  Patient has tried wider shoes has tried soaking ice and other modalities without relief and it continues to give her a problem.  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  I do think surgical intervention in this case is indicated and I discussed this with her and I explained the procedure that would be necessary.  Patient wants surgery and at this point she reviewed and discussed and wants to have this done in January.  I discussed distal osteotomy and patient is scheduled for consult scheduled for surgery in January  X-rays indicate that there is elevation of the  intermetatarsal angle left over right with prominence of the metatarsal head noted bilateral

## 2020-08-20 ENCOUNTER — Other Ambulatory Visit: Payer: Self-pay | Admitting: Family Medicine

## 2020-08-20 DIAGNOSIS — Z1231 Encounter for screening mammogram for malignant neoplasm of breast: Secondary | ICD-10-CM

## 2020-09-12 ENCOUNTER — Encounter (HOSPITAL_COMMUNITY): Payer: Self-pay | Admitting: Emergency Medicine

## 2020-09-12 ENCOUNTER — Emergency Department (HOSPITAL_COMMUNITY)
Admission: EM | Admit: 2020-09-12 | Discharge: 2020-09-12 | Disposition: A | Discharge status: Home / Self Care | Payer: Medicare Other | Attending: Emergency Medicine | Admitting: Emergency Medicine

## 2020-09-12 DIAGNOSIS — I10 Essential (primary) hypertension: Secondary | ICD-10-CM | POA: Diagnosis not present

## 2020-09-12 DIAGNOSIS — Z7952 Long term (current) use of systemic steroids: Secondary | ICD-10-CM | POA: Diagnosis not present

## 2020-09-12 DIAGNOSIS — K219 Gastro-esophageal reflux disease without esophagitis: Secondary | ICD-10-CM | POA: Insufficient documentation

## 2020-09-12 DIAGNOSIS — R1013 Epigastric pain: Secondary | ICD-10-CM | POA: Diagnosis not present

## 2020-09-12 DIAGNOSIS — J4540 Moderate persistent asthma, uncomplicated: Secondary | ICD-10-CM | POA: Diagnosis not present

## 2020-09-12 DIAGNOSIS — Z91040 Latex allergy status: Secondary | ICD-10-CM | POA: Diagnosis not present

## 2020-09-12 DIAGNOSIS — Z7982 Long term (current) use of aspirin: Secondary | ICD-10-CM | POA: Insufficient documentation

## 2020-09-12 DIAGNOSIS — Z859 Personal history of malignant neoplasm, unspecified: Secondary | ICD-10-CM | POA: Diagnosis not present

## 2020-09-12 DIAGNOSIS — Z87891 Personal history of nicotine dependence: Secondary | ICD-10-CM | POA: Diagnosis not present

## 2020-09-12 LAB — URINALYSIS, ROUTINE W REFLEX MICROSCOPIC
Bilirubin Urine: NEGATIVE
Glucose, UA: NEGATIVE mg/dL
Ketones, ur: NEGATIVE mg/dL
Leukocytes,Ua: NEGATIVE
Nitrite: NEGATIVE
Protein, ur: NEGATIVE mg/dL
Specific Gravity, Urine: 1.025 (ref 1.005–1.030)
pH: 5 (ref 5.0–8.0)

## 2020-09-12 LAB — COMPREHENSIVE METABOLIC PANEL
ALT: 14 U/L (ref 0–44)
AST: 18 U/L (ref 15–41)
Albumin: 4.2 g/dL (ref 3.5–5.0)
Alkaline Phosphatase: 79 U/L (ref 38–126)
Anion gap: 12 (ref 5–15)
BUN: 14 mg/dL (ref 6–20)
CO2: 24 mmol/L (ref 22–32)
Calcium: 9.6 mg/dL (ref 8.9–10.3)
Chloride: 104 mmol/L (ref 98–111)
Creatinine, Ser: 0.82 mg/dL (ref 0.44–1.00)
GFR, Estimated: >60 mL/min (ref >60)
Glucose, Bld: 109 mg/dL — ABNORMAL HIGH (ref 70–99)
Potassium: 3.7 mmol/L (ref 3.5–5.1)
Sodium: 140 mmol/L (ref 135–145)
Total Bilirubin: 0.1 mg/dL — ABNORMAL LOW (ref 0.3–1.2)
Total Protein: 7.3 g/dL (ref 6.5–8.1)

## 2020-09-12 LAB — HCG, QUANTITATIVE, PREGNANCY: hCG, Beta Chain, Quant, S: 2 m[IU]/mL (ref <5)

## 2020-09-12 LAB — CBC
HCT: 37.3 % (ref 36.0–46.0)
Hemoglobin: 11.7 g/dL — ABNORMAL LOW (ref 12.0–15.0)
MCH: 28.3 pg (ref 26.0–34.0)
MCHC: 31.4 g/dL (ref 30.0–36.0)
MCV: 90.1 fL (ref 80.0–100.0)
Platelets: 189 10*3/uL (ref 150–400)
RBC: 4.14 MIL/uL (ref 3.87–5.11)
RDW: 14.1 % (ref 11.5–15.5)
WBC: 7.3 10*3/uL (ref 4.0–10.5)
nRBC: 0 % (ref 0.0–0.2)

## 2020-09-12 LAB — LIPASE, BLOOD: Lipase: 30 U/L (ref 11–51)

## 2020-09-12 MED ORDER — LIDOCAINE VISCOUS HCL 2 % MT SOLN
15.0000 mL | Freq: Once | OROMUCOSAL | Status: AC
  Administered 2020-09-12: 15 mL via ORAL
  Filled 2020-09-12: qty 15

## 2020-09-12 MED ORDER — ALUM & MAG HYDROXIDE-SIMETH 200-200-20 MG/5ML PO SUSP
30.0000 mL | Freq: Once | ORAL | Status: AC
  Administered 2020-09-12: 30 mL via ORAL
  Filled 2020-09-12: qty 30

## 2020-09-12 MED ORDER — FAMOTIDINE 20 MG PO TABS: 20.0000 mg | ORAL_TABLET | Freq: Every day | ORAL | 0 refills | Status: AC

## 2020-09-12 MED ORDER — FAMOTIDINE 20 MG PO TABS
20.0000 mg | ORAL_TABLET | Freq: Once | ORAL | Status: AC
  Administered 2020-09-12: 20 mg via ORAL
  Filled 2020-09-12: qty 1

## 2020-09-12 NOTE — ED Provider Notes (Signed)
[facility]-EMERGENCY DEPT Provider Note   CSN: [id number] Arrival date & time: 09/12/20  7829     History Chief Complaint  Patient presents with  . Abdominal Pain    Carly Simmons is a 54 y.o. female.  HPI Patient is a 54 year old female with a history of reflux and hypertension. She is presented today with 3 days of epigastric burning and pressure in her stomach. She states that she is prescribed sucralfate which she takes for same when she wakes up in the morning and just prior to going to bed at night. She states that her symptoms started 3 days ago when she ate some fast food but she described as greasy. She states that the pain seems to be worse when she lays flat better when she sits up somewhat. She denies any chest pain, shortness of breath, nausea, vomiting. She denies any radiation of the burning sensation in her epigastrium. She denies any exertional component. No lightheadedness or dizziness.  She also denies any infectious symptoms such as fevers, chills, congestion, runny nose, cough.  It appears that my review of EMR patient's PCP has prescribed her omeprazole and sucralfate in the past however she is only presently taking sucralfate for her reflux. She states that this does help however the past 3 days her symptoms have gotten worse and more consistent.  No other associated symptoms. No other aggravating or mitigating factors. She states she has not tried Tums or any H2 be medications before. She also does not member being prescribed omeprazole.  Bowel movements have been normal. No melena hematochezia. She states normal bowel movements. No constipation or diarrhea.    Past Medical History:  Diagnosis Date  . Cancer (HCC)   . Hypertension     Patient Active Problem List   Diagnosis Date Noted  . Moderate persistent asthma without complication 12/30/2017  . Other seasonal allergic rhinitis 12/30/2017  . 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 Nasal congestion 07/29/2017  . Seasonal allergies 07/29/2017    Past Surgical History:  Procedure Laterality Date  . ABDOMINAL HYSTERECTOMY       OB History   No obstetric history on file.     Family History  Problem Relation Age of Onset  . Allergic rhinitis Daughter   . Angioedema Neg Hx   . Asthma Neg Hx   . Eczema Neg Hx   . Immunodeficiency Neg Hx   . Urticaria Neg Hx     Social History   Tobacco Use  . Smoking status: Former Smoker    Packs/day: 2.00    Types: Cigarettes    Quit date: 09/14/1988    Years since quitting: 32.0  . Smokeless tobacco: Former Engineer, water Use Topics  . Alcohol use: No    Alcohol/week: 0.0 standard drinks  . Drug use: No    Home Medications Prior to Admission medications   Medication Sig Start Date End Date Taking? Authorizing Provider  famotidine (PEPCID) 20 MG tablet Take 1 tablet (20 mg total) by mouth daily. 09/12/20  Yes Wing Gfeller S, PA  albuterol (PROAIR HFA) 108 (90 Base) MCG/ACT inhaler Inhale 2 puffs into the lungs every 6 (six) hours as needed for wheezing or shortness of breath.    [provider]  Ascorbic Acid (VITAMIN C GUMMIES PO) Take 1-2 tablets by mouth daily.    [provider]  aspirin (BAYER ASPIRIN EC LOW DOSE) 81 MG EC tablet Take 81 mg by mouth  daily. Swallow whole.    [provider]  atorvastatin (LIPITOR) 10 MG tablet Take 10 mg by mouth daily.    [provider]  Cholecalciferol (VITAMIN D3) 50 MCG (2000 UT) TABS Take 2,000 Units by mouth daily.    [provider]  Cyanocobalamin (VITAMIN B-12 PO) Take 1 tablet by mouth daily.    [provider]  ELDERBERRY PO Take 1-2 tablets by mouth daily.     [provider]  EPINEPHrine 0.3 mg/0.3 mL IJ SOAJ injection Inject 0.3 mLs (0.3 mg total) into the muscle once for 1 dose. 12/30/17 11/20/19  Alfonse Spruce, MD  fenofibrate (TRICOR) 145 MG tablet Take 145 mg by mouth  daily.    [provider]  fluticasone (FLONASE) 50 MCG/ACT nasal spray Place 2 sprays daily into both nostrils. 07/29/17   Defelice, Para March, NP  levocetirizine (XYZAL) 5 MG tablet Take 5 mg by mouth daily.    [provider]  losartan-hydrochlorothiazide (HYZAAR) 100-12.5 MG tablet Take 1 tablet by mouth daily.    [provider]  montelukast (SINGULAIR) 10 MG tablet Take 1 tablet (10 mg total) by mouth at bedtime. Patient taking differently: Take 10 mg by mouth daily.  12/30/17   Alfonse Spruce, MD  NON FORMULARY Take by mouth See admin instructions. Sea moss- Mix raw sea moss paste into smoothie and drink once daily for digestive health    [provider]  omeprazole (PRILOSEC) 20 MG capsule TAKE 1 CAPSULE BY MOUTH EVERY DAY 06/16/18   Collie Siad A, MD  sucralfate (CARAFATE) 1 g tablet Take 1 tablet (1 g total) by mouth 4 (four) times daily -  with meals and at bedtime for 7 days. Patient taking differently: Take 1 g by mouth in the morning and at bedtime.  03/19/18 11/20/19  Doristine Bosworth, MD    Allergies    Penicillins, Shellfish allergy, Codeine, Acetic acid, Citrus, Iohexol, Latex, Pineapple, and Tomato  Review of Systems   Review of Systems  Constitutional: Positive for fatigue. Negative for chills and fever.  HENT: Negative for congestion.   Eyes: Negative for pain.  Respiratory: Negative for cough and shortness of breath.   Cardiovascular: Negative for chest pain and leg swelling.  Gastrointestinal: Positive for abdominal pain. Negative for diarrhea, nausea and vomiting.  Genitourinary: Negative for dysuria.  Musculoskeletal: Negative for myalgias.  Skin: Negative for rash.  Neurological: Negative for dizziness and headaches.    Physical Exam Updated Vital Signs BP (!) 134/94   Pulse 66   Temp 98.1 F (36.7 C) (Oral)   Resp (!) 24   Ht 5\' 9"  (1.753 m)   Wt 108.9 kg   LMP 06/21/2012   SpO2 96%   BMI 35.44 kg/m    Physical Exam Vitals and nursing note reviewed.  Constitutional:      General: She is not in acute distress.    Appearance: She is obese.     Comments: Pleasant well-appearing 54 year old.  In no acute distress.  Sitting comfortably in bed.  Able answer questions appropriately follow commands. No increased work of breathing. Speaking in full sentences.  HENT:     Head: Normocephalic and atraumatic.     Nose: Nose normal.  Eyes:     General: No scleral icterus. Cardiovascular:     Rate and Rhythm: Normal rate and regular rhythm.     Pulses: Normal pulses.     Heart sounds: Normal heart sounds.  Pulmonary:  Effort: Pulmonary effort is normal. No respiratory distress.     Breath sounds: No wheezing.  Abdominal:     Palpations: Abdomen is soft.     Tenderness: There is no abdominal tenderness.     Comments: Soft protuberant obese abdomen. No tenderness with light or deep palpation. No guarding or rebound. No CVA tenderness.  Musculoskeletal:     Cervical back: Normal range of motion.     Right lower leg: No edema.     Left lower leg: No edema.  Skin:    General: Skin is warm and dry.     Capillary Refill: Capillary refill takes less than 2 seconds.  Neurological:     Mental Status: She is alert. Mental status is at baseline.  Psychiatric:        Mood and Affect: Mood normal.        Behavior: Behavior normal.     ED Results / Procedures / Treatments   Labs (all labs ordered are listed, but only abnormal results are displayed) Labs Reviewed  COMPREHENSIVE METABOLIC PANEL - Abnormal; Notable for the following components:      Result Value   Glucose, Bld 109 (*)    Total Bilirubin 0.1 (*)    All other components within normal limits  CBC - Abnormal; Notable for the following components:   Hemoglobin 11.7 (*)    All other components within normal limits  URINALYSIS, ROUTINE W REFLEX MICROSCOPIC - Abnormal; Notable for the following components:   Hgb urine dipstick  MODERATE (*)    Bacteria, UA RARE (*)    All other components within normal limits  LIPASE, BLOOD  HCG, QUANTITATIVE, PREGNANCY    EKG None  Radiology No results found.  Procedures Procedures (including critical care time)  Medications Ordered in ED Medications  alum & mag hydroxide-simeth (MAALOX/MYLANTA) 200-200-20 MG/5ML suspension 30 mL (30 mLs Oral Given 09/12/20 0929)    And  lidocaine (XYLOCAINE) 2 % viscous mouth solution 15 mL (15 mLs Oral Given 09/12/20 0930)  famotidine (PEPCID) tablet 20 mg (20 mg Oral Given 09/12/20 0930)    ED Course  I have reviewed the triage vital signs and the nursing notes.  Pertinent labs & imaging results that were available during my care of the patient were reviewed by me and considered in my medical decision making (see chart for details).  Patient is 54 year old female with past medical history significant for reflux. She has been prescribed PPI however she is not on this currently is only taking sucralfate.  She states she is taking sucralfate as prescribed.  Physical exam is unremarkable she has no abdominal tenderness.   Clinical Course as of 09/12/20 1008  Thu Sep 12, 2020  1003 Hgb urine dipstickMarland Kitchen): MODERATE Patient with moderate hemoglobin in her urine.  She was unaware of this.  She will follow up with PCP about this. [WF]  1004 CBC without leukocytosis mild stable anemia.  CMP without any significant electrolyte abnormality. [WF]  1004 Lipase within normal limits.  Doubt pancreatitis.  hCG negative for pregnancy. [WF]  1004 Patient states that she was 4/10 initially is now 1/10 after GI cocktail and p.o. Pepcid.  Will discharge with Pepcid and with close return precautions as well as follow-up instructions to see her PCP.  She will also continue sucralfate that she is prescribed. [WF]    Clinical Course User Index [WF] Gailen Shelter, [place]   On reassessment patient continues to be without any abdominal tenderness.  She  states she is significantly improved.  Will discharge with return precautions.  Patient is tolerating p.o.  Well-appearing.  Understands discharge instructions and plan.  MDM Rules/Calculators/A&P                          Final Clinical Impression(s) / ED Diagnoses Final diagnoses:  Epigastric pain    Rx / DC Orders ED Discharge Orders         Ordered    famotidine (PEPCID) 20 MG tablet  Daily        09/12/20 1006           Pati Gallo G[place], [place] 09/12/20 1008    Truddie Hidden, MD 09/12/20 1343

## 2020-09-12 NOTE — ED Triage Notes (Signed)
Patient has a hx of gerd. Patient is complaining of burning and pressure in her stomach.

## 2020-09-12 NOTE — Discharge Instructions (Signed)
Please use Pepcid once daily as prescribed.  Please follow-up with your primary care doctor to further titrate your reflux medication use. Please continue take your sucralfate as well.  Please read the attached information on dietary choices with reflux disease.

## 2020-09-19 ENCOUNTER — Telehealth: Payer: Self-pay

## 2020-09-19 NOTE — Telephone Encounter (Signed)
DOS 10/01/2020  [place] BUNIONECTOM[facility] LT - 72094  [employer] MEDICARE EFFECTIVE DATE - 09/14/2020  PLAN DEDUCTIBLE - $0.00 OUT OF POCKET - $7550.00 w/ $7550.00 REMAINING  CO-INSURANCE 20% / Day OUTPATIENT SURGERY 20% / Day OUTPATIENT HOSPITAL COPAY $0 / Day OUTPATIENT SURGERY $0 / Day OUTPATIENT HOSPITAL  Notification or Prior Authorization is not required for the requested services  This [employer] Medicare Advantage members plan does not currently require a prior authorization for these services. If you have general questions about the prior authorization requirements, please call us at [phone] or visit [email or web address] > Clinician Resources > Advance and Admission Notification Requirements. The number above acknowledges your notification. Please write this number down for future reference. Notification is not a guarantee of coverage or payment.  Decision ID #:[id number]

## 2020-09-26 ENCOUNTER — Encounter: Payer: Self-pay | Admitting: Podiatry

## 2020-09-26 ENCOUNTER — Other Ambulatory Visit: Payer: Self-pay

## 2020-09-26 ENCOUNTER — Ambulatory Visit (INDEPENDENT_AMBULATORY_CARE_PROVIDER_SITE_OTHER): Payer: Medicare Other | Admitting: Podiatry

## 2020-09-26 DIAGNOSIS — M21612 Bunion of left foot: Secondary | ICD-10-CM | POA: Diagnosis not present

## 2020-09-26 DIAGNOSIS — M21619 Bunion of unspecified foot: Secondary | ICD-10-CM

## 2020-09-30 NOTE — Progress Notes (Signed)
Subjective:   Patient ID: Oval Linsey, female   DOB: 55 y.o.   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  I did go ahead and I allowed her to read consent form going over alternative treatments complications associated with surgery and she is willing to accept this risk want surgery understanding total recovery can take 6 months.  I dispensed her air fracture walker with all instructions on usage and I want her to get used to it prior to surgery along finding shoes on her other foot that feel comfortable.  Reappoint to recheck for procedure

## 2020-10-01 ENCOUNTER — Encounter: Payer: Self-pay | Admitting: Podiatry

## 2020-10-01 ENCOUNTER — Other Ambulatory Visit: Payer: Self-pay | Admitting: Podiatry

## 2020-10-01 DIAGNOSIS — M2012 Hallux valgus (acquired), left foot: Secondary | ICD-10-CM | POA: Diagnosis not present

## 2020-10-01 MED ORDER — ONDANSETRON HCL 4 MG PO TABS: 4.0000 mg | ORAL_TABLET | Freq: Three times a day (TID) | ORAL | 0 refills | Status: AC | PRN

## 2020-10-01 MED ORDER — OXYCODONE-ACETAMINOPHEN 10-325 MG PO TABS: 1.0000 | ORAL_TABLET | ORAL | 0 refills | Status: AC | PRN

## 2020-10-02 ENCOUNTER — Ambulatory Visit: Payer: Medicare Other

## 2020-10-07 ENCOUNTER — Ambulatory Visit (INDEPENDENT_AMBULATORY_CARE_PROVIDER_SITE_OTHER): Payer: Medicare Other | Admitting: Podiatry

## 2020-10-07 ENCOUNTER — Encounter: Payer: Medicare Other | Admitting: Podiatry

## 2020-10-07 ENCOUNTER — Encounter: Payer: Self-pay | Admitting: Podiatry

## 2020-10-07 ENCOUNTER — Other Ambulatory Visit: Payer: Self-pay

## 2020-10-07 ENCOUNTER — Ambulatory Visit (INDEPENDENT_AMBULATORY_CARE_PROVIDER_SITE_OTHER): Payer: Medicare Other

## 2020-10-07 DIAGNOSIS — M21612 Bunion of left foot: Secondary | ICD-10-CM

## 2020-10-07 DIAGNOSIS — M21619 Bunion of unspecified foot: Secondary | ICD-10-CM

## 2020-10-07 NOTE — Progress Notes (Signed)
Subjective:   Patient ID: Carly Simmons, female   DOB: 55 y.o.   MRN: [id number]   HPI Patient states doing very well and pleased so far with surgery   ROS      Objective:  Physical Exam  Neurovascular status intact negative Bevelyn Buckles' sign noted left foot healing well wound edges well coapted hallux in rectus position good alignment noted of the first MPJ     Assessment:  Doing well post osteotomy first metatarsal left     Plan:  Advised on compression elevation and continued immobilization and range of motion exercises first MPJ.  Reviewed x-rays and sterile dressing reapplied and reappoint 2 to 3 weeks or earlier if needed  X-rays indicated fixation is in place good alignment noted no signs of pathology

## 2020-10-30 ENCOUNTER — Ambulatory Visit (INDEPENDENT_AMBULATORY_CARE_PROVIDER_SITE_OTHER): Payer: Medicare Other

## 2020-10-30 ENCOUNTER — Other Ambulatory Visit: Payer: Self-pay

## 2020-10-30 ENCOUNTER — Ambulatory Visit (INDEPENDENT_AMBULATORY_CARE_PROVIDER_SITE_OTHER): Payer: Medicare Other | Admitting: Podiatry

## 2020-10-30 ENCOUNTER — Encounter: Payer: Self-pay | Admitting: Podiatry

## 2020-10-30 DIAGNOSIS — M21612 Bunion of left foot: Secondary | ICD-10-CM

## 2020-10-30 DIAGNOSIS — M21619 Bunion of unspecified foot: Secondary | ICD-10-CM

## 2020-10-30 NOTE — Progress Notes (Signed)
Subjective:   Patient ID: Carly Simmons, female   DOB: 55 y.o.   MRN: [id number]   HPI Patient states she is doing very well with her surgery very pleased with how things are going so far   ROS      Objective:  Physical Exam  Neurovascular status intact negative Bevelyn Buckles' sign noted wound edges left healing well and well coapted with good range of motion no crepitus of the joint noted     Assessment:  Doing well post osteotomy first metatarsal left     Plan:  Discussed range of motion exercises compression and dispensed ankle compression stocking along with continued elevation and immobilization with gradual increase in activity over the next couple weeks and hopeful return to work in 2 weeks.  Patient will be seen back to recheck in overall very pleased  X-rays indicate osteotomies healing well fixation in place

## 2020-11-11 ENCOUNTER — Other Ambulatory Visit: Payer: Self-pay

## 2020-11-11 ENCOUNTER — Ambulatory Visit
Admission: RE | Admit: 2020-11-11 | Discharge: 2020-11-11 | Disposition: A | Discharge status: Home / Self Care | Payer: Medicare Other | Source: Ambulatory Visit | Attending: Family Medicine | Admitting: Family Medicine

## 2020-11-11 DIAGNOSIS — Z1231 Encounter for screening mammogram for malignant neoplasm of breast: Secondary | ICD-10-CM

## 2020-11-13 DIAGNOSIS — M79676 Pain in unspecified toe(s): Secondary | ICD-10-CM

## 2021-04-22 ENCOUNTER — Other Ambulatory Visit: Payer: Self-pay

## 2021-04-22 ENCOUNTER — Encounter (HOSPITAL_COMMUNITY): Payer: Self-pay | Admitting: Emergency Medicine

## 2021-04-22 ENCOUNTER — Emergency Department (HOSPITAL_COMMUNITY): Payer: Medicare Other

## 2021-04-22 ENCOUNTER — Emergency Department (HOSPITAL_COMMUNITY)
Admission: EM | Admit: 2021-04-22 | Discharge: 2021-04-22 | Disposition: A | Discharge status: Home / Self Care | Payer: Medicare Other | Attending: Emergency Medicine | Admitting: Emergency Medicine

## 2021-04-22 DIAGNOSIS — R109 Unspecified abdominal pain: Secondary | ICD-10-CM | POA: Diagnosis present

## 2021-04-22 DIAGNOSIS — I10 Essential (primary) hypertension: Secondary | ICD-10-CM | POA: Diagnosis not present

## 2021-04-22 DIAGNOSIS — Z859 Personal history of malignant neoplasm, unspecified: Secondary | ICD-10-CM | POA: Insufficient documentation

## 2021-04-22 DIAGNOSIS — Z87891 Personal history of nicotine dependence: Secondary | ICD-10-CM | POA: Diagnosis not present

## 2021-04-22 DIAGNOSIS — R079 Chest pain, unspecified: Secondary | ICD-10-CM | POA: Insufficient documentation

## 2021-04-22 DIAGNOSIS — Z79899 Other long term (current) drug therapy: Secondary | ICD-10-CM | POA: Diagnosis not present

## 2021-04-22 DIAGNOSIS — M7981 Nontraumatic hematoma of soft tissue: Secondary | ICD-10-CM | POA: Insufficient documentation

## 2021-04-22 DIAGNOSIS — J4540 Moderate persistent asthma, uncomplicated: Secondary | ICD-10-CM | POA: Insufficient documentation

## 2021-04-22 DIAGNOSIS — Z91040 Latex allergy status: Secondary | ICD-10-CM | POA: Diagnosis not present

## 2021-04-22 DIAGNOSIS — Z7982 Long term (current) use of aspirin: Secondary | ICD-10-CM | POA: Insufficient documentation

## 2021-04-22 DIAGNOSIS — S301XXA Contusion of abdominal wall, initial encounter: Secondary | ICD-10-CM

## 2021-04-22 NOTE — Discharge Instructions (Addendum)
You were seen in the emergency department for evaluation of some bruising in your right lower lateral chest.  These seem to be a regular bruising likely were caused by some sort of trauma that you do not remember.  You can do ice or heat to the local area.  Please return to the emergency department if you notice any other abnormal bruising or other bleeding.

## 2021-04-22 NOTE — ED Triage Notes (Signed)
Patient here from home reporting right side bruising that she noticed 2 days ago. Tender to touch. Denies trauma.

## 2021-04-22 NOTE — ED Provider Notes (Signed)
Emergency Medicine Provider Triage Evaluation Note  Carly Simmons , a 55 y.o. female  was evaluated in triage.  Pt complains of bruising to the right side, with TTP. No other symptoms, denies trauma.  Review of Systems  Positive: Bruises,  Negative: Bleeding gums, bruises elsewhere, shortness of breath palpitations, syncope  Physical Exam  BP (!) 147/78 (BP Location: Left Arm)   Pulse 82   Temp 98.8 F (37.1 C) (Oral)   Resp 18   LMP 06/21/2012   SpO2 97%  Gen:   Awake, no distress   Resp:  Normal effort  MSK:   Moves extremities without difficulty  Other:  Small bruise over the right side of the lateral ribs, TTP  Medical Decision Making  Medically screening exam initiated at 4:57 PM.  Appropriate orders placed.  Carly Simmons was informed that the remainder of the evaluation will be completed by another provider, this initial triage assessment does not replace that evaluation, and the importance of remaining in the ED until their evaluation is complete.  This chart was dictated using voice recognition software, Dragon. Despite the best efforts of this provider to proofread and correct errors, errors may still occur which can change documentation meaning.    Emeline Darling, PA-C [phone]    Lianne Cure, DO [phone]

## 2021-04-22 NOTE — ED Provider Notes (Signed)
[facility] DEPT Provider Note   CSN: [id number] Arrival date & time: 04/22/21  1545     History Chief Complaint  Patient presents with   Bruising     Carly Simmons is a 55 y.o. female.  She is here for evaluation of a tender area on her right lower lateral chest that is been there for few days.  She does not recall any trauma.  The area is tender with palpation.  She does not endorse any other bleeding or bruising anywhere else on her body.  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  She otherwise feels well.  Not on any blood thinners.  The history is provided by the patient.  Abdominal Pain Pain location:  R flank Pain quality: dull   Pain radiates to:  Does not radiate Pain severity:  Mild Onset quality:  Gradual Duration:  2 days Timing:  Constant Progression:  Unchanged Chronicity:  New Context: not trauma   Relieved by:  Cold Worsened by:  Palpation Ineffective treatments:  None tried Associated symptoms: no chest pain, no cough, no fever, no hematemesis, no hematochezia, no hematuria, no melena, no shortness of breath, no vaginal bleeding and no vomiting       Past Medical History:  Diagnosis Date   Cancer ([facility])    Hypertension     Patient Active Problem List   Diagnosis Date Noted   Moderate persistent asthma without complication [id number]   Other seasonal allergic rhinitis 12/30/2017   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     Family History  Problem Relation Age of Onset   Allergic rhinitis Daughter    Angioedema Neg Hx    Asthma Neg Hx    Eczema Neg Hx    Immunodeficiency Neg Hx    Urticaria Neg Hx     Social History   Tobacco  Use   Smoking status: Former    Packs/day: 2.00    Types: Cigarettes    Quit date: 09/14/1988    Years since quitting: 32.6   Smokeless tobacco: Former  Substance Use Topics   Alcohol use: No    Alcohol/week: 0.0 standard drinks   Drug use: No    Home Medications Prior to Admission medications   Medication Sig Start Date End Date Taking? Authorizing Provider  albuterol (VENTOLIN HFA) 108 (90 Base) MCG/ACT inhaler Inhale 2 puffs into the lungs every 6 (six) hours as needed for wheezing or shortness of breath.    [provider]  Ascorbic Acid (VITAMIN C GUMMIES PO) Take 1-2 tablets by mouth daily.    [provider]  aspirin 81 MG EC tablet Take 81 mg by mouth daily. Swallow whole.    [provider]  atorvastatin (LIPITOR) 10 MG tablet Take 10 mg by mouth daily.    [provider]  Cholecalciferol (VITAMIN D3) 50 MCG (2000 UT) TABS Take 2,000 Units by mouth daily.    [provider]  Cyanocobalamin (VITAMIN B-12 PO) Take 1 tablet by mouth daily.    [provider]  ELDERBERRY PO Take 1-2 tablets by mouth daily.     [provider]  EPINEPHrine 0.3 mg/0.3 mL  IJ SOAJ injection Inject 0.3 mLs (0.3 mg total) into the muscle once for 1 dose. 12/30/17 11/20/19  Valentina Shaggy, MD  famotidine (PEPCID) 20 MG tablet Take 1 tablet (20 mg total) by mouth daily. 09/12/20   Tedd Sias, PA  fenofibrate (TRICOR) 145 MG tablet Take 145 mg by mouth daily.    [provider]  fluticasone (FLONASE) 50 MCG/ACT nasal spray Place 2 sprays daily into both nostrils. 0000000   Defelice, Jeanett Schlein, NP  levocetirizine (XYZAL) 5 MG tablet Take 5 mg by mouth daily.    [provider]  losartan-hydrochlorothiazide (HYZAAR) 100-12.5 MG tablet Take 1 tablet by mouth daily.    [provider]  metFORMIN (GLUCOPHAGE-XR) 500 MG 24 hr tablet Take 500 mg by mouth 2 (two) times daily. 10/29/20   [provider]   montelukast (SINGULAIR) 10 MG tablet Take 1 tablet (10 mg total) by mouth at bedtime. Patient taking differently: Take 10 mg by mouth daily. 12/30/17   Valentina Shaggy, MD  NON FORMULARY Take by mouth See admin instructions. Sea moss- Mix raw sea moss paste into smoothie and drink once daily for digestive health    [provider]  omeprazole (PRILOSEC) 20 MG capsule TAKE 1 CAPSULE BY MOUTH EVERY DAY 06/16/18   Delia Chimes A, MD  ondansetron (ZOFRAN) 4 MG tablet Take 1 tablet (4 mg total) by mouth every 8 (eight) hours as needed for nausea or vomiting. 10/01/20   Wallene Huh, DPM  oxyCODONE-acetaminophen (PERCOCET) 10-325 MG tablet Take 1 tablet by mouth every 4 (four) hours as needed for pain. 10/01/20   Wallene Huh, DPM  silver sulfADIAZINE (SILVADENE) 1 % cream Apply topically. 07/02/20   [provider]  sucralfate (CARAFATE) 1 g tablet Take 1 tablet (1 g total) by mouth 4 (four) times daily -  with meals and at bedtime for 7 days. Patient taking differently: Take 1 g by mouth in the morning and at bedtime.  03/19/18 11/20/19  Forrest Moron, MD    Allergies    Penicillins, Shellfish allergy, Codeine, Acetic acid, Citrus, Iohexol, Latex, Pineapple, and Tomato  Review of Systems   Review of Systems  Constitutional:  Negative for fever.  Respiratory:  Negative for cough and shortness of breath.   Cardiovascular:  Negative for chest pain.  Gastrointestinal:  Positive for abdominal pain. Negative for hematemesis, hematochezia, melena and vomiting.  Genitourinary:  Negative for hematuria and vaginal bleeding.   Physical Exam Updated Vital Signs BP (!) 147/78 (BP Location: Left Arm)   Pulse 82   Temp 98.8 F (37.1 C) (Oral)   Resp 18   LMP 06/21/2012   SpO2 97%   Physical Exam Constitutional:      Appearance: She is well-developed.  HENT:     Head: Normocephalic and atraumatic.  Eyes:     Conjunctiva/sclera: Conjunctivae normal.  Cardiovascular:      Rate and Rhythm: Normal rate and regular rhythm.     Pulses: Normal pulses.  Pulmonary:     Effort: Pulmonary effort is normal.     Breath sounds: Normal breath sounds.  Chest:     Comments: She has 2 areas of some bruising and induration on her right lower lateral chest.  There is no crepitus.  No open wounds. Musculoskeletal:     Cervical back: Neck supple.  Skin:    General: Skin is warm and dry.  Neurological:     Mental Status: She is alert.  GCS: GCS eye subscore is 4. GCS verbal subscore is 5. GCS motor subscore is 6.    ED Results / Procedures / Treatments   Labs (all labs ordered are listed, but only abnormal results are displayed) Labs Reviewed - No data to display  EKG None  Radiology DG Ribs Unilateral W/Chest Right  Result Date: 04/22/2021 CLINICAL DATA:  Right rib pain, bruising EXAM: RIGHT RIBS AND CHEST - 3+ VIEW COMPARISON:  None. FINDINGS: No fracture or other bone lesions are seen involving the ribs. There is no evidence of pneumothorax or pleural effusion. Both lungs are clear. Heart size and mediastinal contours are within normal limits. IMPRESSION: Negative. Electronically Signed   By: Rolm Baptise M.D.   On: 04/22/2021 17:46    Procedures Procedures   Medications Ordered in ED Medications - No data to display  ED Course  I have reviewed the triage vital signs and the nursing notes.  Pertinent labs & imaging results that were available during my care of the patient were reviewed by me and considered in my medical decision making (see chart for details).    MDM Rules/Calculators/A&P                           Differential diagnosis includes bruise, rib fractures, pneumothorax, ITP, TTP.  There is no evidence of any other bruising other than locally in that area.  More consistent with trauma even of unrecognized.  X-ray does not show any rib fractures.  Do not feel she needs further testing at this time as otherwise asymptomatic.  Return instructions  discussed Final Clinical Impression(s) / ED Diagnoses Final diagnoses:  Hematoma of right flank, initial encounter    Rx / DC Orders ED Discharge Orders     None        Hayden Rasmussen, MD 04/23/21 1105

## 2021-05-07 ENCOUNTER — Emergency Department (HOSPITAL_COMMUNITY)
Admission: EM | Admit: 2021-05-07 | Discharge: 2021-05-07 | Disposition: A | Discharge status: Home / Self Care | Payer: Medicare Other | Attending: Emergency Medicine | Admitting: Emergency Medicine

## 2021-05-07 ENCOUNTER — Encounter (HOSPITAL_COMMUNITY): Payer: Self-pay

## 2021-05-07 DIAGNOSIS — Z91040 Latex allergy status: Secondary | ICD-10-CM | POA: Diagnosis not present

## 2021-05-07 DIAGNOSIS — Z7982 Long term (current) use of aspirin: Secondary | ICD-10-CM | POA: Diagnosis not present

## 2021-05-07 DIAGNOSIS — H02841 Edema of right upper eyelid: Secondary | ICD-10-CM | POA: Insufficient documentation

## 2021-05-07 DIAGNOSIS — J4540 Moderate persistent asthma, uncomplicated: Secondary | ICD-10-CM | POA: Insufficient documentation

## 2021-05-07 DIAGNOSIS — H5789 Other specified disorders of eye and adnexa: Secondary | ICD-10-CM | POA: Diagnosis not present

## 2021-05-07 DIAGNOSIS — Z859 Personal history of malignant neoplasm, unspecified: Secondary | ICD-10-CM | POA: Diagnosis not present

## 2021-05-07 DIAGNOSIS — I10 Essential (primary) hypertension: Secondary | ICD-10-CM | POA: Diagnosis not present

## 2021-05-07 DIAGNOSIS — H01021 Squamous blepharitis right upper eyelid: Secondary | ICD-10-CM

## 2021-05-07 DIAGNOSIS — Z7984 Long term (current) use of oral hypoglycemic drugs: Secondary | ICD-10-CM | POA: Diagnosis not present

## 2021-05-07 DIAGNOSIS — Z87891 Personal history of nicotine dependence: Secondary | ICD-10-CM | POA: Insufficient documentation

## 2021-05-07 DIAGNOSIS — H53149 Visual discomfort, unspecified: Secondary | ICD-10-CM | POA: Diagnosis not present

## 2021-05-07 DIAGNOSIS — Z79899 Other long term (current) drug therapy: Secondary | ICD-10-CM | POA: Diagnosis not present

## 2021-05-07 MED ORDER — ERYTHROMYCIN 5 MG/GM OP OINT: TOPICAL_OINTMENT | OPHTHALMIC | 0 refills | Status: AC

## 2021-05-07 NOTE — Discharge Instructions (Signed)
-  Follow up with your eye doctor in 48 hours for vision recheck -Continue doing the compresses -Apply erythromycin ointment to the eyelid twice daily

## 2021-05-07 NOTE — ED Triage Notes (Signed)
Patient reports right eyelid swelling that started yesterday. Patient spoke with eye doctor and used warm compresses and antibiotic ointment  this morning with no relief.   2/10 pain states he feels better than this morning.   Reports it itchy, irritated, throbbing.    A/Ox4 Ambulatory in triage

## 2021-05-07 NOTE — ED Provider Notes (Signed)
[facility] DEPT Provider Note   CSN: [id number] Arrival date & time: 05/07/21  1116     History No chief complaint on file.   Carly Simmons is a 55 y.o. female.  HPI   Patient presents with right eye swelling x 2 days. Woke up with swollen eye lid. Assoc with pruritis, watery eye drainage, and photophobia.  She uses ophthalmic erythromycin and cold compress, she states the swelling improved this morning.  There is some mild pain, 2 out of 10.  Has been unable to see her ophthalmologist, the erythromycin ointment is from a prior conjunctivitis.  She denies any visual changes.  No new changes in contact solution, no new stomach drops.  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     Family History  Problem Relation Age of Onset   Allergic rhinitis Daughter    Angioedema Neg Hx    Asthma Neg Hx    Eczema Neg Hx    Immunodeficiency Neg Hx    Urticaria Neg Hx     Social History   Tobacco Use   Smoking status: Former    Packs/day: 2.00    Types: Cigarettes    Quit date: 09/14/1988    Years since quitting: 32.6   Smokeless tobacco: Former  Substance Use Topics   Alcohol use: No    Alcohol/week: 0.0 standard drinks   Drug use: No    Home Medications Prior to Admission medications   Medication Sig Start Date End Date Taking? Authorizing Provider  albuterol (VENTOLIN HFA) 108 (90 Base) MCG/ACT inhaler Inhale 2 puffs into the lungs every 6 (six) hours as needed for wheezing or shortness of breath.     [provider]  Ascorbic Acid (VITAMIN C GUMMIES PO) Take 1-2 tablets by mouth daily.    [provider]  aspirin 81 MG EC tablet Take 81 mg by mouth daily. Swallow whole.    [provider]  atorvastatin (LIPITOR) 10 MG tablet Take 10 mg by mouth daily.    [provider]  Cholecalciferol (VITAMIN D3) 50 MCG (2000 UT) TABS Take 2,000 Units by mouth daily.    [provider]  Cyanocobalamin (VITAMIN B-12 PO) Take 1 tablet by mouth daily.    [provider]  ELDERBERRY PO Take 1-2 tablets by mouth daily.     [provider]  EPINEPHrine 0.3 mg/0.3 mL IJ SOAJ injection Inject 0.3 mLs (0.3 mg total) into the muscle once for 1 dose. 12/30/17 11/20/19  Valentina Shaggy, MD  famotidine (PEPCID) 20 MG tablet Take 1 tablet (20 mg total) by mouth daily. 09/12/20   Tedd Sias, PA  fenofibrate (TRICOR) 145 MG tablet Take 145 mg by mouth daily.    [provider]  fluticasone (FLONASE) 50 MCG/ACT nasal spray Place 2 sprays daily into both nostrils. 0000000   Defelice, Jeanett Schlein, NP  levocetirizine (XYZAL) 5 MG tablet Take 5 mg by mouth daily.    [provider]  losartan-hydrochlorothiazide (HYZAAR) 100-12.5  MG tablet Take 1 tablet by mouth daily.    [provider]  metFORMIN (GLUCOPHAGE-XR) 500 MG 24 hr tablet Take 500 mg by mouth 2 (two) times daily. 10/29/20   [provider]  montelukast (SINGULAIR) 10 MG tablet Take 1 tablet (10 mg total) by mouth at bedtime. Patient taking differently: Take 10 mg by mouth daily. 12/30/17   Valentina Shaggy, MD  NON FORMULARY Take by mouth See admin instructions. Sea moss- Mix raw sea moss paste into smoothie and drink once daily for digestive health    [provider]  omeprazole (PRILOSEC) 20 MG capsule TAKE 1 CAPSULE BY MOUTH EVERY DAY 06/16/18   Delia Chimes A, MD  ondansetron (ZOFRAN) 4 MG tablet Take 1 tablet (4 mg total) by mouth every 8  (eight) hours as needed for nausea or vomiting. 10/01/20   Wallene Huh, DPM  oxyCODONE-acetaminophen (PERCOCET) 10-325 MG tablet Take 1 tablet by mouth every 4 (four) hours as needed for pain. 10/01/20   Wallene Huh, DPM  silver sulfADIAZINE (SILVADENE) 1 % cream Apply topically. 07/02/20   [provider]  sucralfate (CARAFATE) 1 g tablet Take 1 tablet (1 g total) by mouth 4 (four) times daily -  with meals and at bedtime for 7 days. Patient taking differently: Take 1 g by mouth in the morning and at bedtime.  03/19/18 11/20/19  Forrest Moron, MD    Allergies    Penicillins, Shellfish allergy, Codeine, Acetic acid, Citrus, Iohexol, Latex, Pineapple, and Tomato  Review of Systems   Review of Systems  Constitutional:  Negative for fever.  HENT:  Negative for congestion.   Eyes:  Positive for photophobia, pain and itching. Negative for discharge.   Physical Exam Updated Vital Signs BP (!) 125/97 (BP Location: Left Arm)   Pulse 84   Temp 98.9 F (37.2 C) (Oral)   Resp 16   LMP 06/21/2012   SpO2 97%   Physical Exam Vitals and nursing note reviewed. Exam conducted with a chaperone present.  Constitutional:      General: She is not in acute distress.    Appearance: Normal appearance.  HENT:     Head: Normocephalic and atraumatic.  Eyes:     General: No scleral icterus.    Extraocular Movements: Extraocular movements intact.     Conjunctiva/sclera: Conjunctivae normal.     Pupils: Pupils are equal, round, and reactive to light.     Comments: Right eye with edema to the upper eyelid.  No discharge, conjunctive is within normal limits.  EOMI fully intact, pupils were equal and reactive to light.  Skin:    Coloration: Skin is not jaundiced.  Neurological:     Mental Status: She is alert. Mental status is at baseline.     Coordination: Coordination normal.     ED Results / Procedures / Treatments   Labs (all labs ordered are listed, but only abnormal results are  displayed) Labs Reviewed - No data to display  EKG None  Radiology No results found.  Procedures Procedures   Medications Ordered in ED Medications - No data to display  ED Course  I have reviewed the triage vital signs and the nursing notes.  Pertinent labs & imaging results that were available during my care of the patient were reviewed by me and considered in my medical decision making (see chart for details).  Clinical Course as of 05/07/21 1957  Wed May 07, 2021  1207 Patient not in room.  [  HS]    Clinical Course User Index [HS] Sherrill Raring, PA-C   MDM Rules/Calculators/A&P                           Patient vitals are stable, she is not in any acute distress.  Her visual acuity is slightly decreased in the right eye, although I do suspect this is likely due to the erythromycin ointment she is applied.  Discussed with Dr. Armandina Gemma who viewed the photo seen in the PE section of this note, we agree we think the swelling to her upper eyelid is most consistent with blepharitis.  This also fits that there has been improvement with topical Erica Meissen and heat compresses.  Advised patient to continue doing these things, also advised her to follow-up with her ophthalmologist in the next 2 days for vision recheck.  We will represcribe her with her mycin ointment as the tube she is using is clearly old.  I also advised her to throw away any old make-up brushes and should not use contacts until improved.  Return precautions given, patient is appropriate for discharge at this time.  Final Clinical Impression(s) / ED Diagnoses Final diagnoses:  None    Rx / DC Orders ED Discharge Orders     None        Sherrill Raring, Hershal Coria 05/07/21 2000    Regan Lemming, MD 05/08/21 [id number]

## 2021-05-09 ENCOUNTER — Other Ambulatory Visit: Payer: Self-pay

## 2021-05-09 ENCOUNTER — Encounter (HOSPITAL_COMMUNITY): Payer: Self-pay

## 2021-05-09 ENCOUNTER — Emergency Department (HOSPITAL_COMMUNITY)
Admission: EM | Admit: 2021-05-09 | Discharge: 2021-05-09 | Disposition: A | Discharge status: Home / Self Care | Payer: Medicare Other | Attending: Emergency Medicine | Admitting: Emergency Medicine

## 2021-05-09 DIAGNOSIS — Z87891 Personal history of nicotine dependence: Secondary | ICD-10-CM | POA: Insufficient documentation

## 2021-05-09 DIAGNOSIS — Z859 Personal history of malignant neoplasm, unspecified: Secondary | ICD-10-CM | POA: Insufficient documentation

## 2021-05-09 DIAGNOSIS — Z91040 Latex allergy status: Secondary | ICD-10-CM | POA: Insufficient documentation

## 2021-05-09 DIAGNOSIS — Z79899 Other long term (current) drug therapy: Secondary | ICD-10-CM | POA: Diagnosis not present

## 2021-05-09 DIAGNOSIS — H01001 Unspecified blepharitis right upper eyelid: Secondary | ICD-10-CM | POA: Diagnosis present

## 2021-05-09 DIAGNOSIS — J4540 Moderate persistent asthma, uncomplicated: Secondary | ICD-10-CM | POA: Insufficient documentation

## 2021-05-09 DIAGNOSIS — Z7951 Long term (current) use of inhaled steroids: Secondary | ICD-10-CM | POA: Insufficient documentation

## 2021-05-09 DIAGNOSIS — Z7982 Long term (current) use of aspirin: Secondary | ICD-10-CM | POA: Diagnosis not present

## 2021-05-09 DIAGNOSIS — I10 Essential (primary) hypertension: Secondary | ICD-10-CM | POA: Diagnosis not present

## 2021-05-09 MED ORDER — TETRACAINE HCL 0.5 % OP SOLN
2.0000 [drp] | Freq: Once | OPHTHALMIC | Status: AC
  Administered 2021-05-09: 2 [drp] via OPHTHALMIC
  Filled 2021-05-09: qty 4

## 2021-05-09 MED ORDER — FLUORESCEIN SODIUM 1 MG OP STRP
1.0000 | ORAL_STRIP | Freq: Once | OPHTHALMIC | Status: AC
  Administered 2021-05-09: 1 via OPHTHALMIC
  Filled 2021-05-09: qty 1

## 2021-05-09 NOTE — ED Notes (Signed)
Pt given warm compress for right eye

## 2021-05-09 NOTE — ED Provider Notes (Signed)
Emergency Medicine Provider Triage Evaluation Note  Carly Simmons , a 55 y.o. female  was evaluated in triage.  Pt complains of continued swelling around the right eye.  Seen in the ED 2 days ago, diagnosed with blepharitis, prescribed erythromycin ointment and encouraged to continue doing warm compresses and follow-up with ophthalmology.  She has a follow-up appointment in 2 to 3 weeks but reports that she was concerned that the swelling was still present and not really improving and today she had some purulent drainage and crusting in the eye.  Reports that in general the eye just feels irritated with tenderness over the upper eyelid  Review of Systems  Positive: Eye pain, eye swelling, eye drainage Negative: Fever  Physical Exam  BP (!) 185/99 (BP Location: Right Arm)   Pulse 84   Temp 98 F (36.7 C) (Oral)   Resp 18   LMP 06/21/2012   SpO2 99%  Gen:   Awake, no distress   Resp:  Normal effort  MSK:   Moves extremities without difficulty  Other:  Swelling over the right upper eyelid, no purulent drainage noted, no proptosis, no swelling below the eye.  Sclera clear without erythema  Medical Decision Making  Medically screening exam initiated at 2:14 PM.  Appropriate orders placed.  Zanijah Halfmann was informed that the remainder of the evaluation will be completed by another provider, this initial triage assessment does not replace that evaluation, and the importance of remaining in the ED until their evaluation is complete.     Jacqlyn Larsen, PA-C 05/09/21 1418    Lacretia Leigh, MD 05/13/21 1447

## 2021-05-09 NOTE — Discharge Instructions (Addendum)
Today you are seen at [facility] emergency department with continued symptoms of blepharitis.  While you were here we examined your eye under a [place] lamp exam.  This showed that there was no abrasion or ulceration to your eye.  Additionally we took a measurement of the pressure of your eye which was normal.  At this time it appears that you are continuing to have symptoms of blepharitis.  Please increase the amount of warm compresses that you are performing during the day.  You may also do light massage over the eye to promote drainage.  We discussed your concerns about returning to work.  As stated you did not have viral or bacterial infection that is contagious.  As long as you maintain adequate hand hygiene, you should be safe to return to work. please do not cover the eye.  Continue to use your erythromycin ointment on the eye.  Attached your discharge paperwork we have the on-call ophthalmology Dr. Parks Ranger you can call if you need to be seen sooner than your current ophthalmology appointment.  Please also return to the emergency department if you have increased drainage, feeling pressure in your eye, fevers, or increased swelling around your eye.  Thank you for trusting [place] with your care

## 2021-05-09 NOTE — ED Provider Notes (Signed)
[facility] DEPT Provider Note   CSN: [id number] Arrival date & time: 05/09/21  1357     History Chief Complaint  Patient presents with   Eye Problem    Carly Simmons is a 55 y.o. female.  Seen in emergency department 2 days ago and diagnosed with blepharitis.  Was prescribed erythromycin ointment and instructed to use warm compresses and follow-up with ophthalmology.  States that she had follow-up appointment in 2 to 3 weeks however concerned with persistent swelling and purulent drainage.  States that she has done warm compresses twice since being seen in the emergency department. denies swelling of the lower eyelid or feeling pressure in the eye.  Denies fevers, itching, watery discharge.   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     Family History  Problem Relation Age of Onset   Allergic rhinitis Daughter    Angioedema Neg Hx    Asthma Neg Hx    Eczema Neg Hx    Immunodeficiency Neg Hx    Urticaria Neg Hx     Social History   Tobacco Use   Smoking status: Former    Packs/day: 2.00    Types: Cigarettes    Quit date: 09/14/1988    Years since quitting: 32.6   Smokeless tobacco: Former  Substance Use Topics   Alcohol use: No    Alcohol/week: 0.0 standard drinks   Drug use: No    Home Medications Prior to Admission medications   Medication Sig Start Date End Date Taking? Authorizing Provider  albuterol (VENTOLIN HFA) 108  (90 Base) MCG/ACT inhaler Inhale 2 puffs into the lungs every 6 (six) hours as needed for wheezing or shortness of breath.    [provider]  Ascorbic Acid (VITAMIN C GUMMIES PO) Take 1-2 tablets by mouth daily.    [provider]  aspirin 81 MG EC tablet Take 81 mg by mouth daily. Swallow whole.    [provider]  atorvastatin (LIPITOR) 10 MG tablet Take 10 mg by mouth daily.    [provider]  Cholecalciferol (VITAMIN D3) 50 MCG (2000 UT) TABS Take 2,000 Units by mouth daily.    [provider]  Cyanocobalamin (VITAMIN B-12 PO) Take 1 tablet by mouth daily.    [provider]  ELDERBERRY PO Take 1-2 tablets by mouth daily.     [provider]  EPINEPHrine 0.3 mg/0.3 mL IJ SOAJ injection Inject 0.3 mLs (0.3 mg total) into the muscle once for 1 dose. 12/30/17 11/20/19  Valentina Shaggy, MD  erythromycin ophthalmic ointment Place a 1/2 inch ribbon of ointment into the upper eyelid. 05/07/21   Sherrill Raring, PA-C  famotidine (PEPCID) 20 MG tablet Take 1 tablet (20 mg total) by mouth daily. 09/12/20   Tedd Sias, PA  fenofibrate (TRICOR) 145 MG tablet Take 145 mg by mouth daily.    [provider]  fluticasone (FLONASE) 50 MCG/ACT  nasal spray Place 2 sprays daily into both nostrils. 0000000   Defelice, Jeanett Schlein, NP  levocetirizine (XYZAL) 5 MG tablet Take 5 mg by mouth daily.    [provider]  losartan-hydrochlorothiazide (HYZAAR) 100-12.5 MG tablet Take 1 tablet by mouth daily.    [provider]  metFORMIN (GLUCOPHAGE-XR) 500 MG 24 hr tablet Take 500 mg by mouth 2 (two) times daily. 10/29/20   [provider]  montelukast (SINGULAIR) 10 MG tablet Take 1 tablet (10 mg total) by mouth at bedtime. Patient taking differently: Take 10 mg by mouth daily. 12/30/17   Valentina Shaggy, MD  NON FORMULARY Take by mouth See admin instructions. Sea moss- Mix raw sea moss paste into smoothie and drink  once daily for digestive health    [provider]  omeprazole (PRILOSEC) 20 MG capsule TAKE 1 CAPSULE BY MOUTH EVERY DAY 06/16/18   Delia Chimes A, MD  ondansetron (ZOFRAN) 4 MG tablet Take 1 tablet (4 mg total) by mouth every 8 (eight) hours as needed for nausea or vomiting. 10/01/20   Wallene Huh, DPM  oxyCODONE-acetaminophen (PERCOCET) 10-325 MG tablet Take 1 tablet by mouth every 4 (four) hours as needed for pain. 10/01/20   Wallene Huh, DPM  silver sulfADIAZINE (SILVADENE) 1 % cream Apply topically. 07/02/20   [provider]  sucralfate (CARAFATE) 1 g tablet Take 1 tablet (1 g total) by mouth 4 (four) times daily -  with meals and at bedtime for 7 days. Patient taking differently: Take 1 g by mouth in the morning and at bedtime.  03/19/18 11/20/19  Forrest Moron, MD    Allergies    Penicillins, Shellfish allergy, Codeine, Acetic acid, Citrus, Iohexol, Latex, Pineapple, and Tomato  Review of Systems   Review of Systems  Eyes:  Positive for discharge and redness.  All other systems reviewed and are negative.  Physical Exam Updated Vital Signs BP (!) 185/99 (BP Location: Right Arm)   Pulse 84   Temp 98 F (36.7 C) (Oral)   Resp 18   LMP 06/21/2012   SpO2 99%   Physical Exam Vitals and nursing note reviewed.  Constitutional:      Appearance: She is not toxic-appearing.  HENT:     Head: Normocephalic and atraumatic.  Eyes:     General: Vision grossly intact. Gaze aligned appropriately.        Right eye: Discharge present.        Left eye: No discharge.     Intraocular pressure: Right eye pressure is 14 mmHg. Measurements were taken using an automated tonometer.    Extraocular Movements: Extraocular movements intact.     Conjunctiva/sclera: Conjunctivae normal.     Right eye: No chemosis or exudate.    Pupils: Pupils are equal, round, and reactive to light.     Comments: Right upper lid with redness and swelling.   Musculoskeletal:     Cervical  back: Neck supple.  Neurological:     General: No focal deficit present.     Mental Status: She is alert.     Cranial Nerves: No cranial nerve deficit.    ED Results / Procedures / Treatments   Labs (all labs ordered are listed, but only abnormal results are displayed) Labs Reviewed - No data to display  EKG None  Radiology No results found.  Procedures Procedures   Medications Ordered in ED Medications  fluorescein ophthalmic strip 1 strip (has no administration in time range)  tetracaine (PONTOCAINE)  0.5 % ophthalmic solution 2 drop (has no administration in time range)    ED Course  I have reviewed the triage vital signs and the nursing notes.  Pertinent labs & imaging results that were available during my care of the patient were reviewed by me and considered in my medical decision making (see chart for details).    MDM Rules/Calculators/A&P Aurora [place] is a 55 year old female who presents with blepharitis.  Stated above she was seen in the emergency department 2 days ago and prescribed erythromycin ointment and to follow-up with ophthalmology.  She has an appointment in 2 to 3 weeks.  On repeat exam it appears that she continues to have blepharitis.  Tetracaine solution was used to numb the eye.  Fluorescein was applied to the eye under Woods lamp exam appears to be small amount of increased uptake over the lower iris at the 6 o'clock position.  However there is no concern for corneal abrasion or ulceration at this time.  Automated tonometer was used to measure IOM which showed 14.  There is no concern for periorbital cellulitis on exam. At this time it is safe for discharge.  Given instructions to continue using erythromycin ointment, increased amount of warm compresses with massage.  Additionally we will give her on-call ophthalmology option for follow-up if she feels she needs to be seen sooner than current scheduled appointment.  Is agreeable to the plan. Final  Clinical Impression(s) / ED Diagnoses Final diagnoses:  Blepharitis of right upper eyelid, unspecified type   Rx / DC Orders ED Discharge Orders     None        Mickie Hillier, PA-C 05/09/21 1539    Lacretia Leigh, MD 05/13/21 1447

## 2021-05-09 NOTE — ED Triage Notes (Signed)
Pt reports increased eye swelling and drainage since 8/24. Pt reports using erythromycin ointment as prescribed.

## 2021-10-21 ENCOUNTER — Other Ambulatory Visit: Payer: Self-pay | Admitting: Family Medicine

## 2021-10-21 DIAGNOSIS — Z1231 Encounter for screening mammogram for malignant neoplasm of breast: Secondary | ICD-10-CM

## 2021-11-12 ENCOUNTER — Other Ambulatory Visit: Payer: Self-pay

## 2021-11-12 ENCOUNTER — Ambulatory Visit
Admission: RE | Admit: 2021-11-12 | Discharge: 2021-11-12 | Disposition: A | Discharge status: Home / Self Care | Payer: 59 | Source: Ambulatory Visit | Attending: Family Medicine | Admitting: Family Medicine

## 2021-11-12 DIAGNOSIS — Z1231 Encounter for screening mammogram for malignant neoplasm of breast: Secondary | ICD-10-CM

## 2022-08-13 IMAGING — MG MM DIGITAL SCREENING BILAT W/ TOMO AND CAD
8 series · 8 of 24 positions shown · non-contrast
Comparison: Previous exam(s).

CLINICAL DATA: Screening.

EXAM:
DIGITAL SCREENING BILATERAL MAMMOGRAM WITH TOMOSYNTHESIS AND CAD
TECHNIQUE: Bilateral screening digital craniocaudal and mediolateral oblique
mammograms were obtained. Bilateral screening digital breast
tomosynthesis was performed. The images were evaluated with
computer-aided detection.

[R MLO synth-2D]
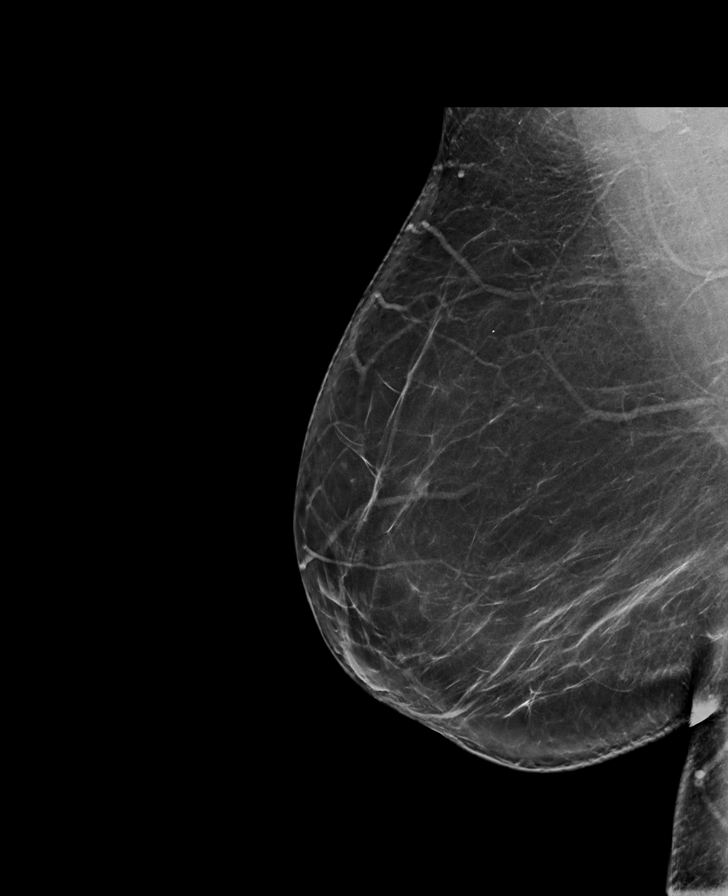

[L CC synth-2D]
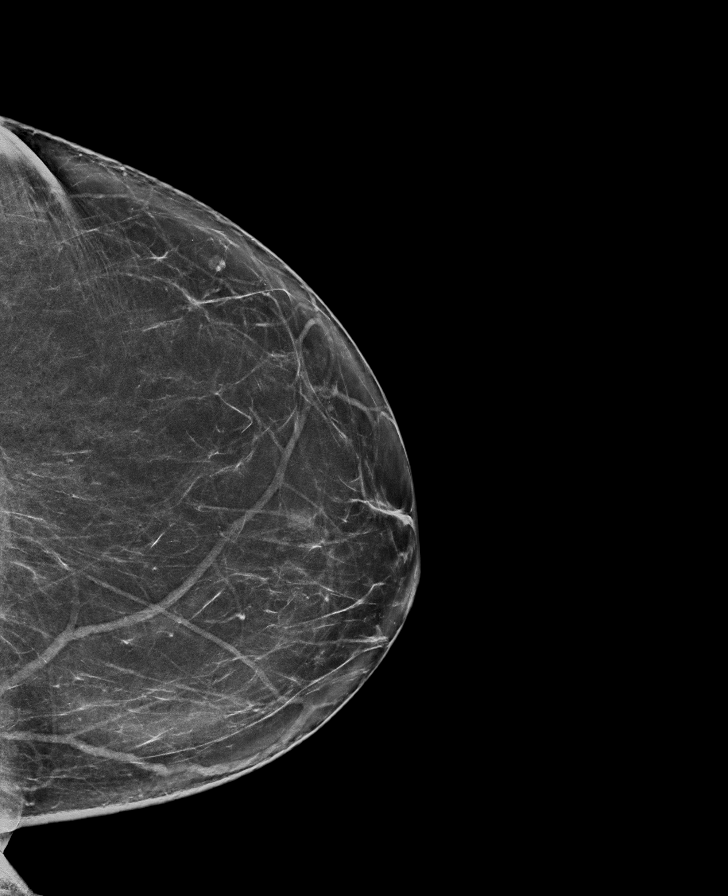

[L MLO synth-2D]
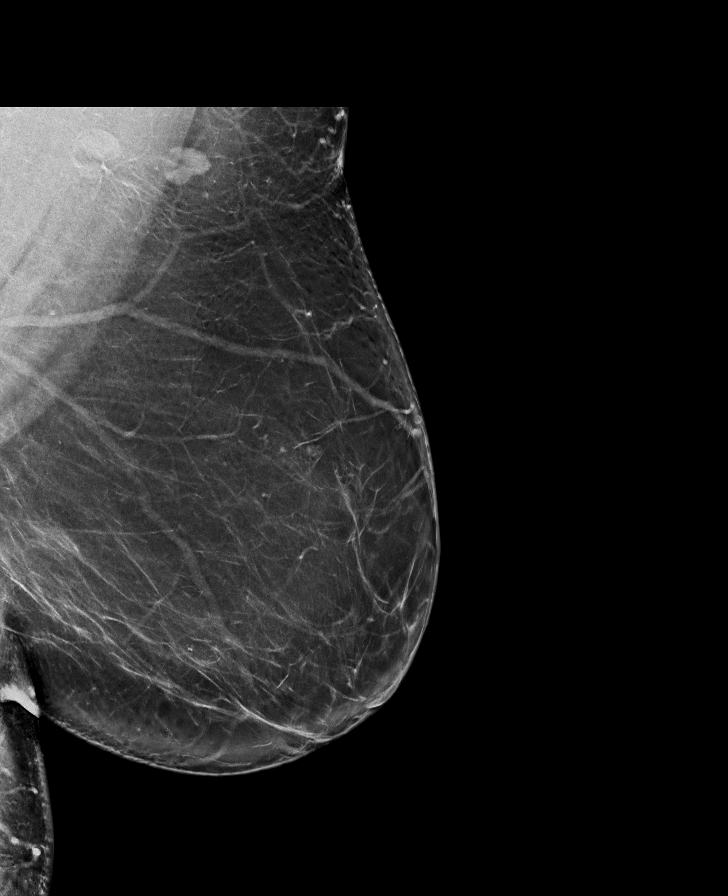

[R CC synth-2D]
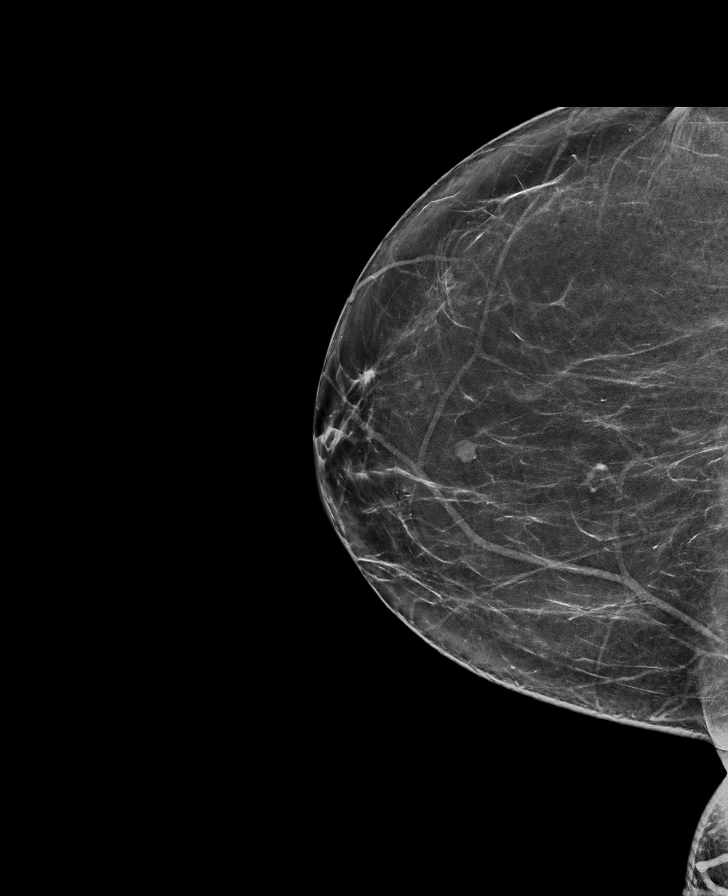

[L CC tomo · tomo slice 39/77.0]
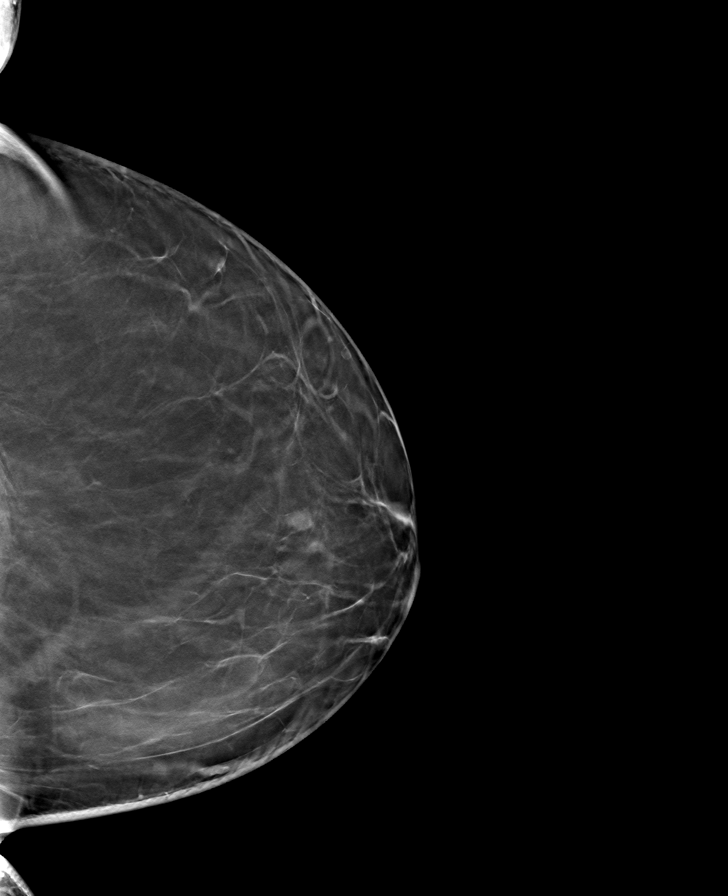

[R MLO tomo · tomo slice 46/91.0]
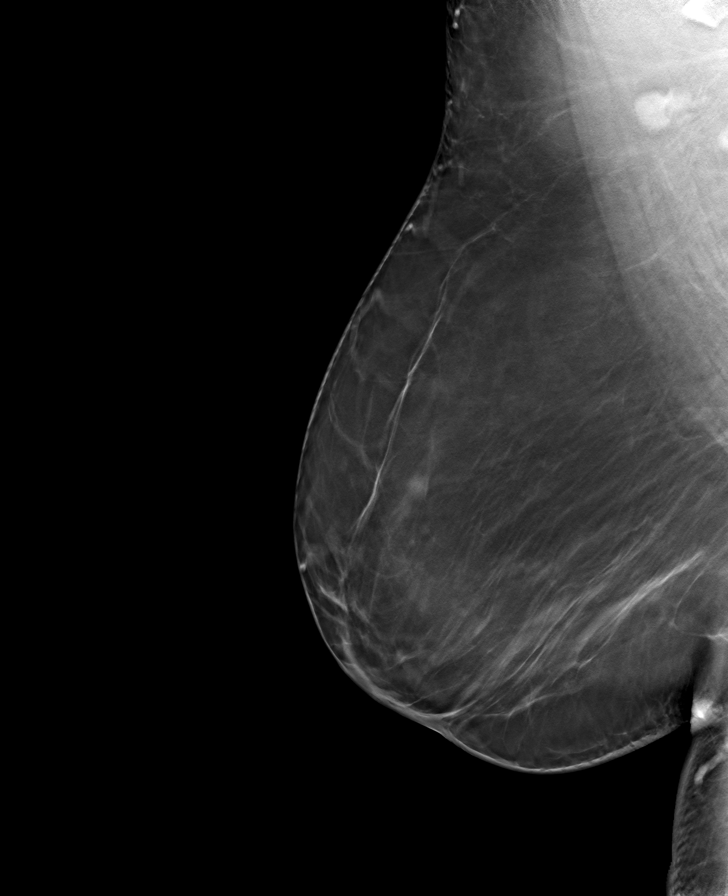

[L MLO tomo · tomo slice 49/97.0]
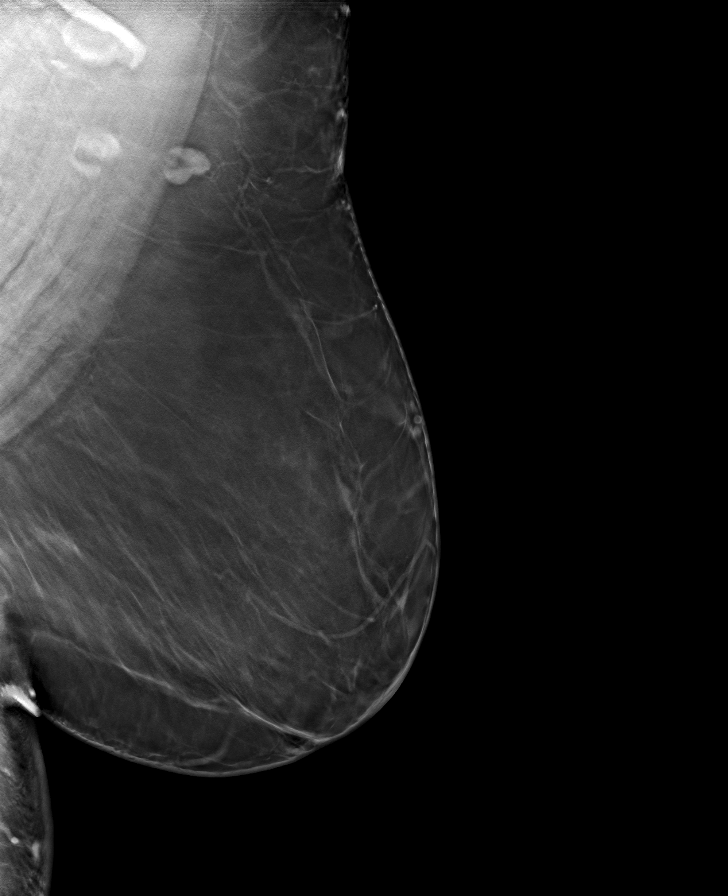

[R CC tomo · tomo slice 38/75.0]
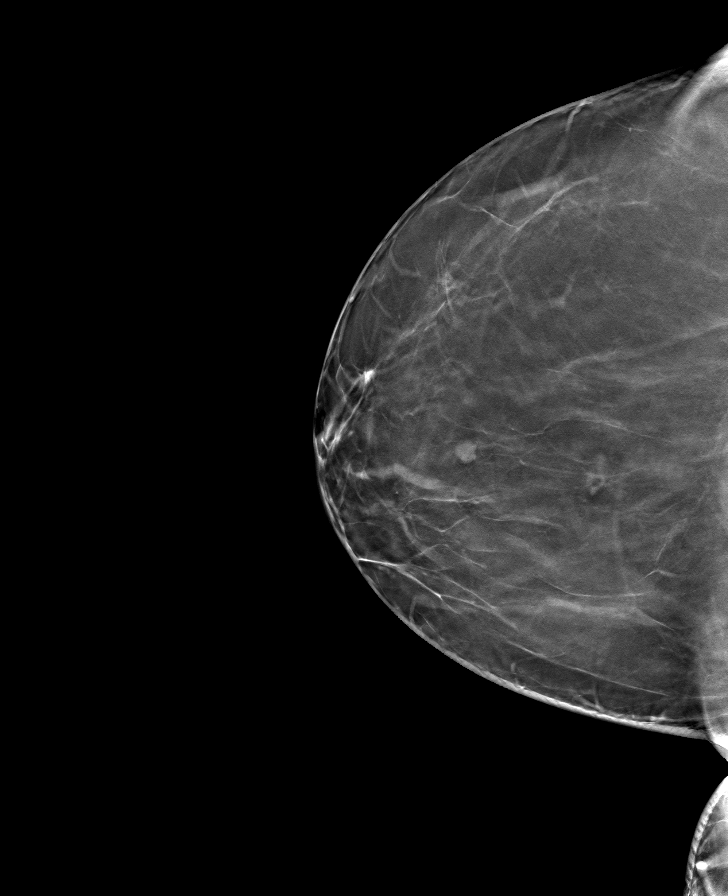

[8 of 24 positions shown; findings below may reference images not displayed]

ACR Breast Density Category b: There are scattered areas of
fibroglandular density.
FINDINGS: There are no findings suspicious for malignancy.
IMPRESSION: No mammographic evidence of malignancy. A result letter of this
screening mammogram will be mailed directly to the patient.

RECOMMENDATION:
Screening mammogram in one year. (Code:51-O-LD2)

BI-RADS CATEGORY  1: Negative.

## 2022-09-21 ENCOUNTER — Emergency Department (HOSPITAL_COMMUNITY)
Admission: EM | Admit: 2022-09-21 | Discharge: 2022-09-21 | Disposition: A | Discharge status: Home / Self Care | Payer: 59 | Attending: Emergency Medicine | Admitting: Emergency Medicine

## 2022-09-21 ENCOUNTER — Encounter (HOSPITAL_COMMUNITY): Payer: Self-pay | Admitting: Emergency Medicine

## 2022-09-21 DIAGNOSIS — Z79899 Other long term (current) drug therapy: Secondary | ICD-10-CM | POA: Diagnosis not present

## 2022-09-21 DIAGNOSIS — Z8542 Personal history of malignant neoplasm of other parts of uterus: Secondary | ICD-10-CM | POA: Diagnosis not present

## 2022-09-21 DIAGNOSIS — R0981 Nasal congestion: Secondary | ICD-10-CM | POA: Diagnosis present

## 2022-09-21 DIAGNOSIS — J020 Streptococcal pharyngitis: Secondary | ICD-10-CM | POA: Diagnosis not present

## 2022-09-21 DIAGNOSIS — Z91040 Latex allergy status: Secondary | ICD-10-CM | POA: Insufficient documentation

## 2022-09-21 DIAGNOSIS — U071 COVID-19: Secondary | ICD-10-CM | POA: Diagnosis not present

## 2022-09-21 DIAGNOSIS — I10 Essential (primary) hypertension: Secondary | ICD-10-CM | POA: Diagnosis not present

## 2022-09-21 DIAGNOSIS — Z20822 Contact with and (suspected) exposure to covid-19: Secondary | ICD-10-CM | POA: Diagnosis not present

## 2022-09-21 DIAGNOSIS — Z7982 Long term (current) use of aspirin: Secondary | ICD-10-CM | POA: Diagnosis not present

## 2022-09-21 HISTORY — DX: Malignant neoplasm of uterus, part unspecified: C55

## 2022-09-21 LAB — GROUP A STREP BY PCR: Group A Strep by PCR: DETECTED — AB

## 2022-09-21 LAB — RESP PANEL BY RT-PCR (RSV, FLU A&B, COVID)  RVPGX2
Influenza A by PCR: NEGATIVE
Influenza B by PCR: NEGATIVE
Resp Syncytial Virus by PCR: NEGATIVE
SARS Coronavirus 2 by RT PCR: POSITIVE — AB

## 2022-09-21 MED ORDER — AZITHROMYCIN 250 MG PO TABS: ORAL_TABLET | ORAL | 0 refills | Status: AC

## 2022-09-21 MED ORDER — AZITHROMYCIN 250 MG PO TABS
500.0000 mg | ORAL_TABLET | Freq: Once | ORAL | Status: AC
  Administered 2022-09-21: 500 mg via ORAL
  Filled 2022-09-21: qty 2

## 2022-09-21 NOTE — ED Triage Notes (Signed)
Patient stating she has had a sinus infection since Christmas, states now she has had difficulty swallowing over the last few days. Says her throat "feels tight"

## 2022-09-21 NOTE — ED Provider Notes (Signed)
[facility] DEPT Provider Note: Georgena Spurling, MD, FACEP  CSN: [id number] MRN: [id number] ARRIVAL: 09/21/22 at [place]: [id number]   CHIEF COMPLAINT  Sore Throat   HISTORY OF PRESENT ILLNESS  09/21/22 1:57 AM Carly Simmons is a 57 y.o. female who has had a "sinus infection" since Christmas.  Specifically she has had nasal congestion, sinus pressure, postnasal drip and a cough.  She has not been tested or treated for this.  Over the past few days she has developed a sore throat.  She rates her pain as a 5 out of 10, worse with swallowing.  She says her throat feels tight.    Past Medical History:  Diagnosis Date   Hypertension    Uterine cancer ([place])     Past Surgical History:  Procedure Laterality Date   ABDOMINAL HYSTERECTOMY      Family History  Problem Relation Age of Onset   Allergic rhinitis Daughter    Angioedema Neg Hx    Asthma Neg Hx    Eczema Neg Hx    Immunodeficiency Neg Hx    Urticaria Neg Hx     Social History   Tobacco Use   Smoking status: Former    Packs/day: 2.00    Types: Cigarettes    Quit date: 09/14/1988    Years since quitting: 34.0   Smokeless tobacco: Former  Substance Use Topics   Alcohol use: No    Alcohol/week: 0.0 standard drinks of alcohol   Drug use: No    Prior to Admission medications   Medication Sig Start Date End Date Taking? Authorizing Provider  azithromycin (ZITHROMAX) 250 MG tablet Take 2 tablets daily starting 09/22/2022. 09/21/22  Yes Ebenezer Mccaskey, MD  albuterol (VENTOLIN HFA) 108 (90 Base) MCG/ACT inhaler Inhale 2 puffs into the lungs every 6 (six) hours as needed for wheezing or shortness of breath.    [provider]  Ascorbic Acid (VITAMIN C GUMMIES PO) Take 1-2 tablets by mouth daily.    [provider]  aspirin 81 MG EC tablet Take 81 mg by mouth daily. Swallow whole.    [provider]  atorvastatin (LIPITOR) 10 MG tablet Take 10 mg by mouth daily.    [provider]   Cholecalciferol (VITAMIN D3) 50 MCG (2000 UT) TABS Take 2,000 Units by mouth daily.    [provider]  Cyanocobalamin (VITAMIN B-12 PO) Take 1 tablet by mouth daily.    [provider]  ELDERBERRY PO Take 1-2 tablets by mouth daily.     [provider]  EPINEPHrine 0.3 mg/0.3 mL IJ SOAJ injection Inject 0.3 mLs (0.3 mg total) into the muscle once for 1 dose. 12/30/17 11/20/19  Valentina Shaggy, MD  erythromycin ophthalmic ointment Place a 1/2 inch ribbon of ointment into the upper eyelid. 05/07/21   Sherrill Raring, PA-C  famotidine (PEPCID) 20 MG tablet Take 1 tablet (20 mg total) by mouth daily. 09/12/20   Tedd Sias, PA  fenofibrate (TRICOR) 145 MG tablet Take 145 mg by mouth daily.    [provider]  fluticasone (FLONASE) 50 MCG/ACT nasal spray Place 2 sprays daily into both nostrils. 24/82/50   Defelice, Jeanett Schlein, NP  levocetirizine (XYZAL) 5 MG tablet Take 5 mg by mouth daily.    [provider]  losartan-hydrochlorothiazide (HYZAAR) 100-12.5 MG tablet Take 1 tablet by mouth daily.    [provider]  metFORMIN (GLUCOPHAGE-XR) 500 MG 24 hr tablet Take 500 mg by mouth 2 (two) times daily.  10/29/20   [provider]  montelukast (SINGULAIR) 10 MG tablet Take 1 tablet (10 mg total) by mouth at bedtime. Patient taking differently: Take 10 mg by mouth daily. 12/30/17   Valentina Shaggy, MD  NON FORMULARY Take by mouth See admin instructions. Sea moss- Mix raw sea moss paste into smoothie and drink once daily for digestive health    [provider]  omeprazole (PRILOSEC) 20 MG capsule TAKE 1 CAPSULE BY MOUTH EVERY DAY 06/16/18   Delia Chimes A, MD  ondansetron (ZOFRAN) 4 MG tablet Take 1 tablet (4 mg total) by mouth every 8 (eight) hours as needed for nausea or vomiting. 10/01/20   Wallene Huh, DPM  oxyCODONE-acetaminophen (PERCOCET) 10-325 MG tablet Take 1 tablet by mouth every 4 (four) hours as needed for pain.  10/01/20   Wallene Huh, DPM  silver sulfADIAZINE (SILVADENE) 1 % cream Apply topically. 07/02/20   [provider]  sucralfate (CARAFATE) 1 g tablet Take 1 tablet (1 g total) by mouth 4 (four) times daily -  with meals and at bedtime for 7 days. Patient taking differently: Take 1 g by mouth in the morning and at bedtime.  03/19/18 11/20/19  Forrest Moron, MD    Allergies Penicillins, Shellfish allergy, Codeine, Acetic acid, Citrus, Iohexol, Latex, Pineapple, and Tomato   REVIEW OF SYSTEMS  Negative except as noted here or in the History of Present Illness.   PHYSICAL EXAMINATION  Initial Vital Signs Blood pressure (!) 172/100, pulse 93, temperature 98.2 F (36.8 C), temperature source Oral, resp. rate 18, height '5\' 9"'$  (1.753 m), weight 108.9 kg, last menstrual period 06/21/2012, SpO2 99 %.  Examination General: Well-developed, well-nourished female in no acute distress; appearance consistent with age of record HENT: normocephalic; atraumatic; mild pharyngeal erythema and edema without exudate; uvula midline; no stridor; no dysphonia; TMs normal Eyes: Normal appearance Neck: supple; anterior cervical lymphadenopathy Heart: regular rate and rhythm Lungs: clear to auscultation bilaterally Abdomen: soft; nondistended; nontender; bowel sounds present Extremities: No deformity; full range of motion Neurologic: Awake, alert and oriented; motor function intact in all extremities and symmetric; no facial droop Skin: Warm and dry Psychiatric: Normal mood and affect   RESULTS  Summary of this visit's results, reviewed and interpreted by myself:   EKG Interpretation  Date/Time:    Ventricular Rate:    PR Interval:    QRS Duration:   QT Interval:    QTC Calculation:   R Axis:     Text Interpretation:         Laboratory Studies: Results for orders placed or performed during the hospital encounter of 09/21/22 (from the past 24 hour(s))  Resp panel by RT-PCR (RSV, Flu  A&B, Covid) Anterior Nasal Swab     Status: Abnormal   Collection Time: 09/21/22  2:17 AM   Specimen: Anterior Nasal Swab  Result Value Ref Range   SARS Coronavirus 2 by RT PCR POSITIVE (A) NEGATIVE   Influenza A by PCR NEGATIVE NEGATIVE   Influenza B by PCR NEGATIVE NEGATIVE   Resp Syncytial Virus by PCR NEGATIVE NEGATIVE  Group A Strep by PCR     Status: Abnormal   Collection Time: 09/21/22  2:19 AM   Specimen: Throat; Sterile Swab  Result Value Ref Range   Group A Strep by PCR DETECTED (A) NOT DETECTED   Imaging Studies: No results found.  ED COURSE and MDM  Nursing notes, initial and subsequent vitals signs, including pulse oximetry, reviewed and interpreted  by myself.  Vitals:   09/21/22 0138  BP: (!) 172/100  Pulse: 93  Resp: 18  Temp: 98.2 F (36.8 C)  TempSrc: Oral  SpO2: 99%  Weight: 108.9 kg  Height: '5\' 9"'$  (1.753 m)   Medications  azithromycin (ZITHROMAX) tablet 500 mg (has no administration in time range)   Positive for COVID and strep throat.  We will treat the strep throat with Zithromax as she is allergic to penicillin.  She does not meet any risk group criteria that would justify Paxlovid at this time.  Also I suspect her COVID has been present for more than just a few days as she has had symptoms since Christmas.   PROCEDURES  Procedures   ED DIAGNOSES     ICD-10-CM   1. Strep pharyngitis  J02.0     2. COVID-19 virus infection  U07.1          Perri Aragones, MD 09/21/22 2162

## 2022-10-19 ENCOUNTER — Other Ambulatory Visit: Payer: Self-pay | Admitting: Family Medicine

## 2022-10-19 DIAGNOSIS — Z1231 Encounter for screening mammogram for malignant neoplasm of breast: Secondary | ICD-10-CM

## 2022-11-26 ENCOUNTER — Ambulatory Visit: Payer: BLUE CROSS/BLUE SHIELD

## 2023-01-13 ENCOUNTER — Inpatient Hospital Stay: Admission: RE | Admit: 2023-01-13 | Payer: BLUE CROSS/BLUE SHIELD | Source: Ambulatory Visit
